# Patient Record
Sex: Male | Born: 1975 | Race: White | Hispanic: No | Marital: Single | Smoking: Never smoker
Health system: Southern US, Community
[De-identification: ages and names within clinical notes are randomized; demographics above are authoritative.]

## PROBLEM LIST (undated history)

## (undated) DIAGNOSIS — M109 Gout, unspecified: Secondary | ICD-10-CM

## (undated) DIAGNOSIS — R911 Solitary pulmonary nodule: Secondary | ICD-10-CM

## (undated) DIAGNOSIS — F329 Major depressive disorder, single episode, unspecified: Secondary | ICD-10-CM

## (undated) DIAGNOSIS — R42 Dizziness and giddiness: Secondary | ICD-10-CM

## (undated) DIAGNOSIS — F410 Panic disorder [episodic paroxysmal anxiety] without agoraphobia: Secondary | ICD-10-CM

## (undated) DIAGNOSIS — M419 Scoliosis, unspecified: Secondary | ICD-10-CM

## (undated) DIAGNOSIS — N5089 Other specified disorders of the male genital organs: Secondary | ICD-10-CM

## (undated) DIAGNOSIS — G5790 Unspecified mononeuropathy of unspecified lower limb: Secondary | ICD-10-CM

## (undated) DIAGNOSIS — K284 Chronic or unspecified gastrojejunal ulcer with hemorrhage: Secondary | ICD-10-CM

## (undated) DIAGNOSIS — F429 Obsessive-compulsive disorder, unspecified: Secondary | ICD-10-CM

## (undated) DIAGNOSIS — M4800 Spinal stenosis, site unspecified: Secondary | ICD-10-CM

## (undated) DIAGNOSIS — N200 Calculus of kidney: Secondary | ICD-10-CM

## (undated) DIAGNOSIS — K274 Chronic or unspecified peptic ulcer, site unspecified, with hemorrhage: Secondary | ICD-10-CM

## (undated) DIAGNOSIS — G629 Polyneuropathy, unspecified: Secondary | ICD-10-CM

## (undated) HISTORY — PX: KIDNEY STONE SURGERY: SHX686

## (undated) HISTORY — PX: OTHER SURGICAL HISTORY: SHX169

---

## 2002-02-04 ENCOUNTER — Emergency Department (HOSPITAL_COMMUNITY): Admission: EM | Admit: 2002-02-04 | Discharge: 2002-02-04 | Payer: Self-pay | Admitting: Emergency Medicine

## 2005-02-04 ENCOUNTER — Emergency Department (HOSPITAL_COMMUNITY): Admission: EM | Admit: 2005-02-04 | Discharge: 2005-02-04 | Payer: Self-pay | Admitting: Emergency Medicine

## 2005-12-24 ENCOUNTER — Emergency Department (HOSPITAL_COMMUNITY): Admission: EM | Admit: 2005-12-24 | Discharge: 2005-12-24 | Payer: Self-pay | Admitting: Emergency Medicine

## 2008-03-10 ENCOUNTER — Emergency Department (HOSPITAL_COMMUNITY): Admission: EM | Admit: 2008-03-10 | Discharge: 2008-03-10 | Payer: Self-pay | Admitting: Emergency Medicine

## 2008-06-02 ENCOUNTER — Inpatient Hospital Stay (HOSPITAL_COMMUNITY): Admission: AD | Admit: 2008-06-02 | Discharge: 2008-06-03 | Payer: Self-pay | Admitting: *Deleted

## 2008-06-02 ENCOUNTER — Ambulatory Visit: Payer: Self-pay | Admitting: *Deleted

## 2008-06-02 ENCOUNTER — Emergency Department (HOSPITAL_COMMUNITY): Admission: EM | Admit: 2008-06-02 | Discharge: 2008-06-02 | Payer: Self-pay | Admitting: Emergency Medicine

## 2009-06-13 ENCOUNTER — Emergency Department (HOSPITAL_COMMUNITY): Admission: EM | Admit: 2009-06-13 | Discharge: 2009-06-14 | Payer: Self-pay | Admitting: Emergency Medicine

## 2009-06-13 IMAGING — CR DG CHEST 2V
2 series · 2 of 2 positions shown · non-contrast
Comparison: [DATE]

CLINICAL DATA: Cough, tingling and pain and extremities

CHEST - 2 VIEW

[w chest lat]
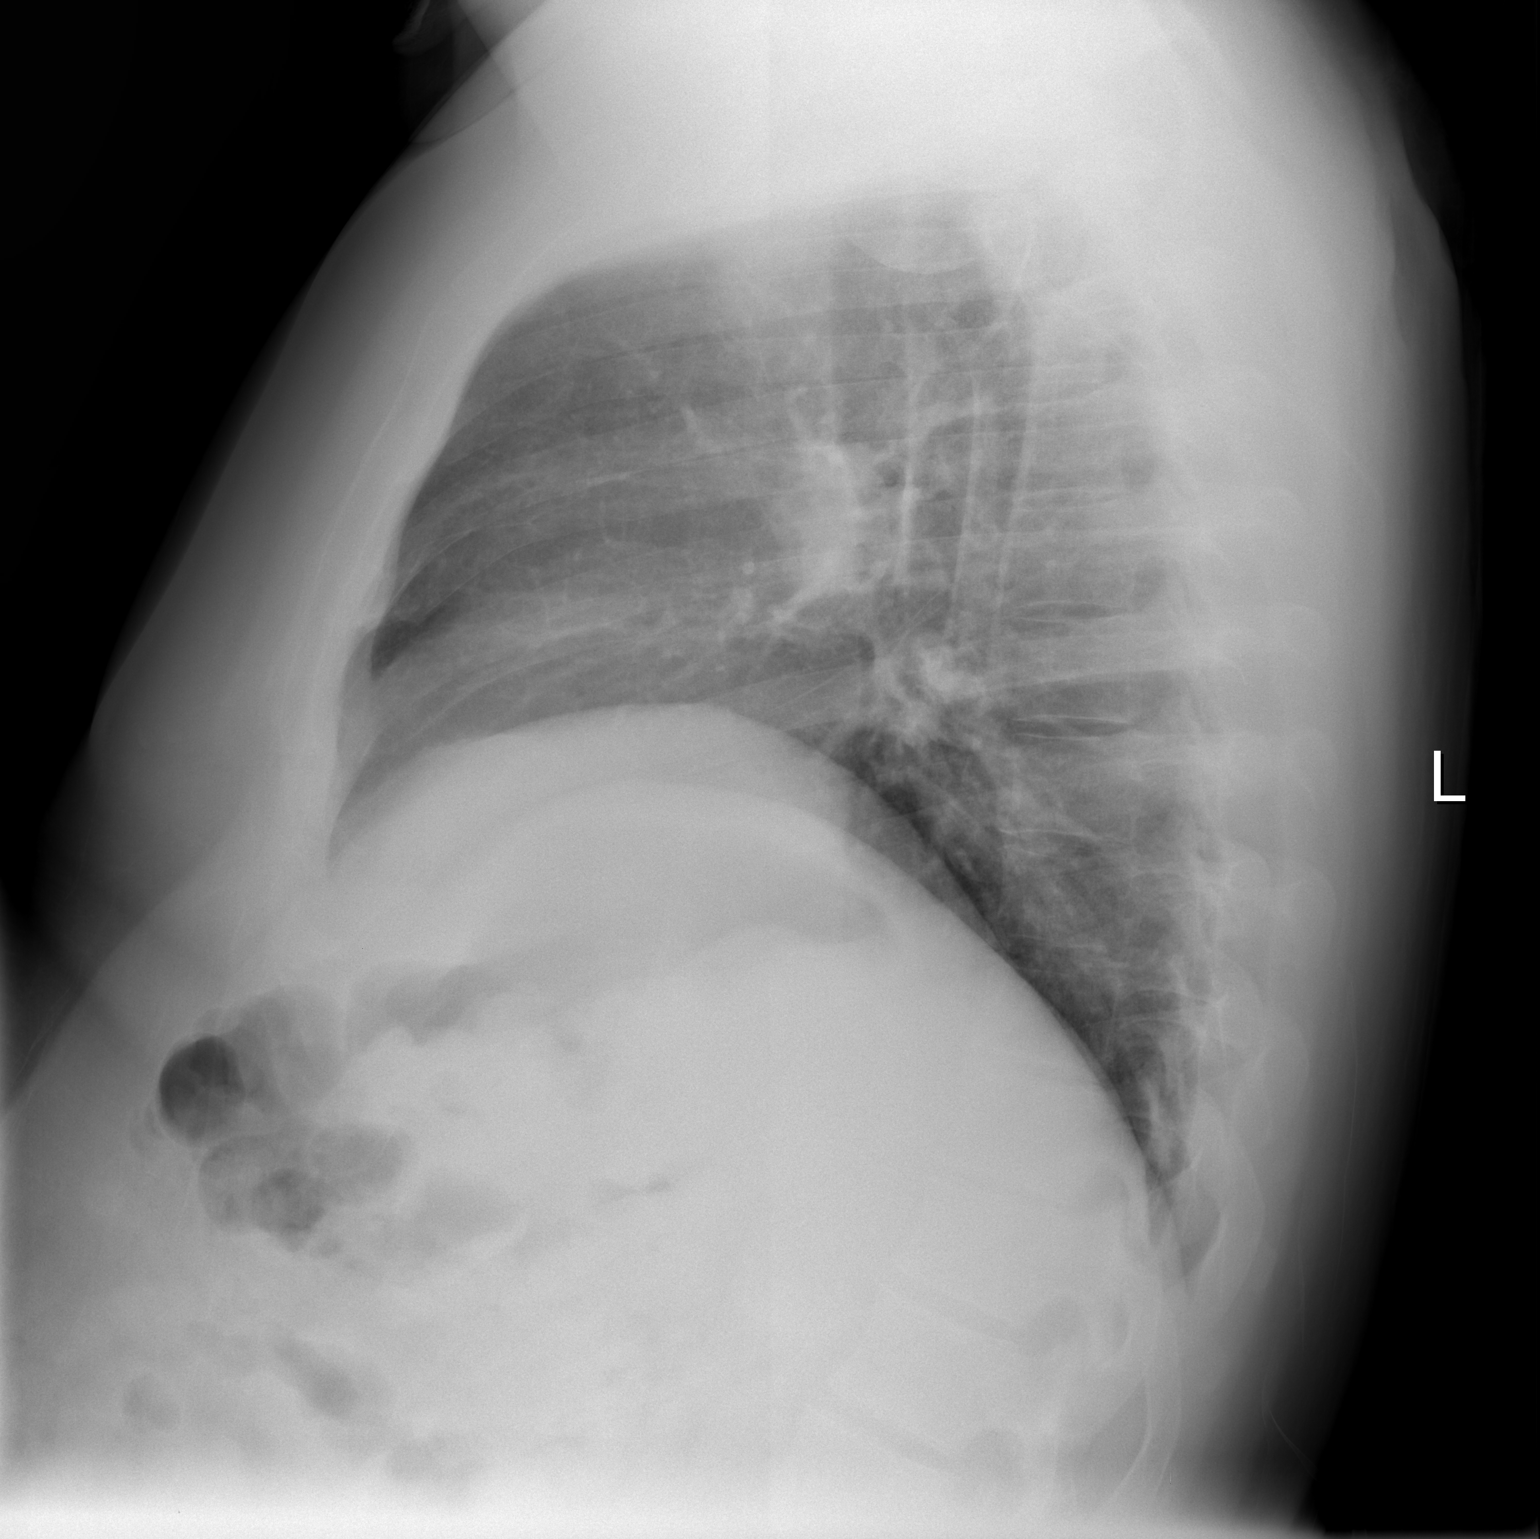

[view not recorded]
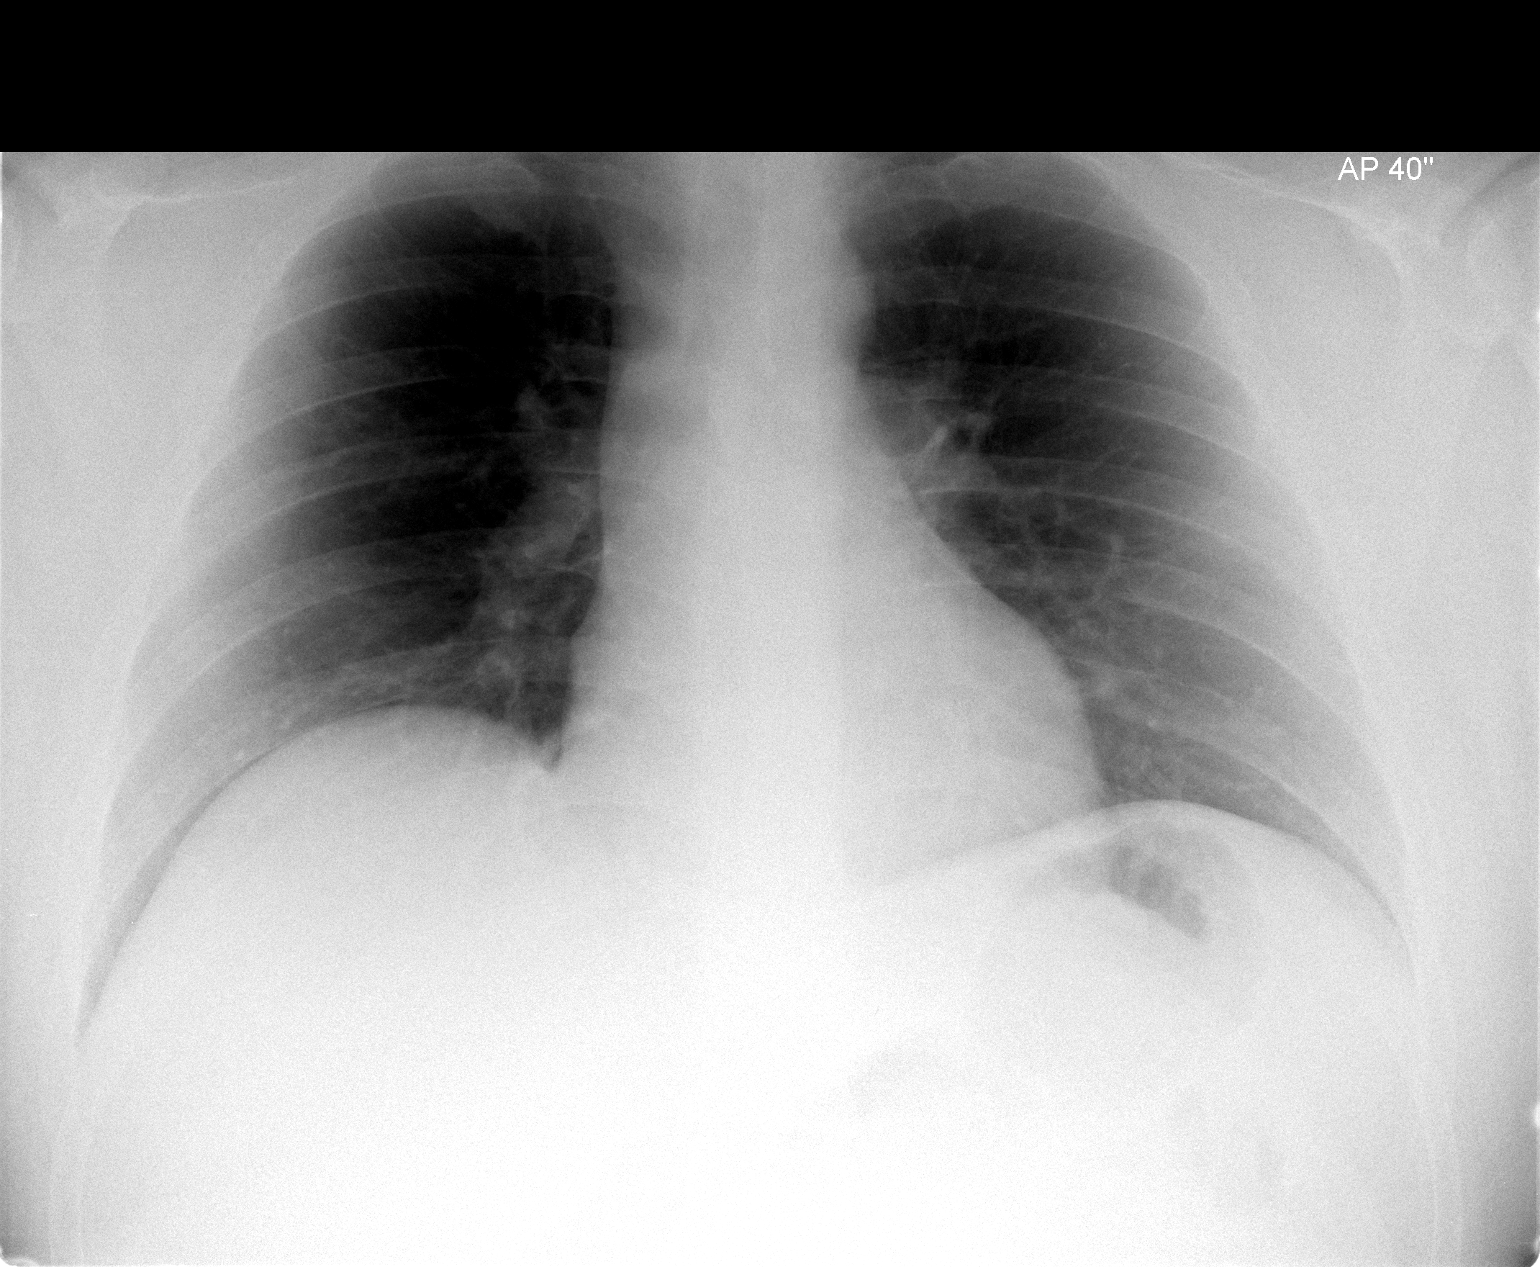

[2 of 2 positions shown; findings below may reference images not displayed]

FINDINGS: Normal heart size, mediastinal contours, and pulmonary vascularity.
Lungs clear.
Bones unremarkable.
No pneumothorax.
IMPRESSION: No acute abnormalities.

## 2011-02-24 LAB — CBC
Hemoglobin: 15.7 g/dL (ref 13.0–17.0)
MCHC: 34.3 g/dL (ref 30.0–36.0)
Platelets: 257 10*3/uL (ref 150–400)
RDW: 13.5 % (ref 11.5–15.5)

## 2011-02-24 LAB — DIFFERENTIAL
Basophils Relative: 0 % (ref 0–1)
Eosinophils Absolute: 0.4 10*3/uL (ref 0.0–0.7)
Eosinophils Relative: 3 % (ref 0–5)
Lymphs Abs: 3.2 10*3/uL (ref 0.7–4.0)
Monocytes Absolute: 1.1 10*3/uL — ABNORMAL HIGH (ref 0.1–1.0)
Monocytes Relative: 9 % (ref 3–12)

## 2011-02-24 LAB — URINALYSIS, ROUTINE W REFLEX MICROSCOPIC
Ketones, ur: NEGATIVE mg/dL
Nitrite: NEGATIVE
Specific Gravity, Urine: 1.027 (ref 1.005–1.030)
Urobilinogen, UA: 1 mg/dL (ref 0.0–1.0)
pH: 6 (ref 5.0–8.0)

## 2011-02-24 LAB — COMPREHENSIVE METABOLIC PANEL
ALT: 28 U/L (ref 0–53)
AST: 21 U/L (ref 0–37)
Albumin: 3.8 g/dL (ref 3.5–5.2)
Alkaline Phosphatase: 74 U/L (ref 39–117)
Calcium: 8.4 mg/dL (ref 8.4–10.5)
GFR calc Af Amer: >60 mL/min (ref >60)
Glucose, Bld: 102 mg/dL — ABNORMAL HIGH (ref 70–99)
Potassium: 4.1 mEq/L (ref 3.5–5.1)
Sodium: 140 mEq/L (ref 135–145)
Total Protein: 6.3 g/dL (ref 6.0–8.3)

## 2011-03-28 ENCOUNTER — Emergency Department (INDEPENDENT_AMBULATORY_CARE_PROVIDER_SITE_OTHER): Payer: Self-pay

## 2011-03-28 ENCOUNTER — Emergency Department (HOSPITAL_BASED_OUTPATIENT_CLINIC_OR_DEPARTMENT_OTHER)
Admission: EM | Admit: 2011-03-28 | Discharge: 2011-03-28 | Disposition: A | Discharge status: Home / Self Care | Payer: Self-pay | Attending: Emergency Medicine | Admitting: Emergency Medicine

## 2011-03-28 DIAGNOSIS — M79609 Pain in unspecified limb: Secondary | ICD-10-CM

## 2011-03-28 DIAGNOSIS — M899 Disorder of bone, unspecified: Secondary | ICD-10-CM

## 2011-03-28 IMAGING — CR DG TOE GREAT 2+V*R*
3 series · 3 of 3 positions shown · non-contrast
Comparison: None.

CLINICAL DATA: Pain in the right first toe for 3-year 4 days
without injury.

RIGHT TOE - 2+ VIEW

[t toes ap right]
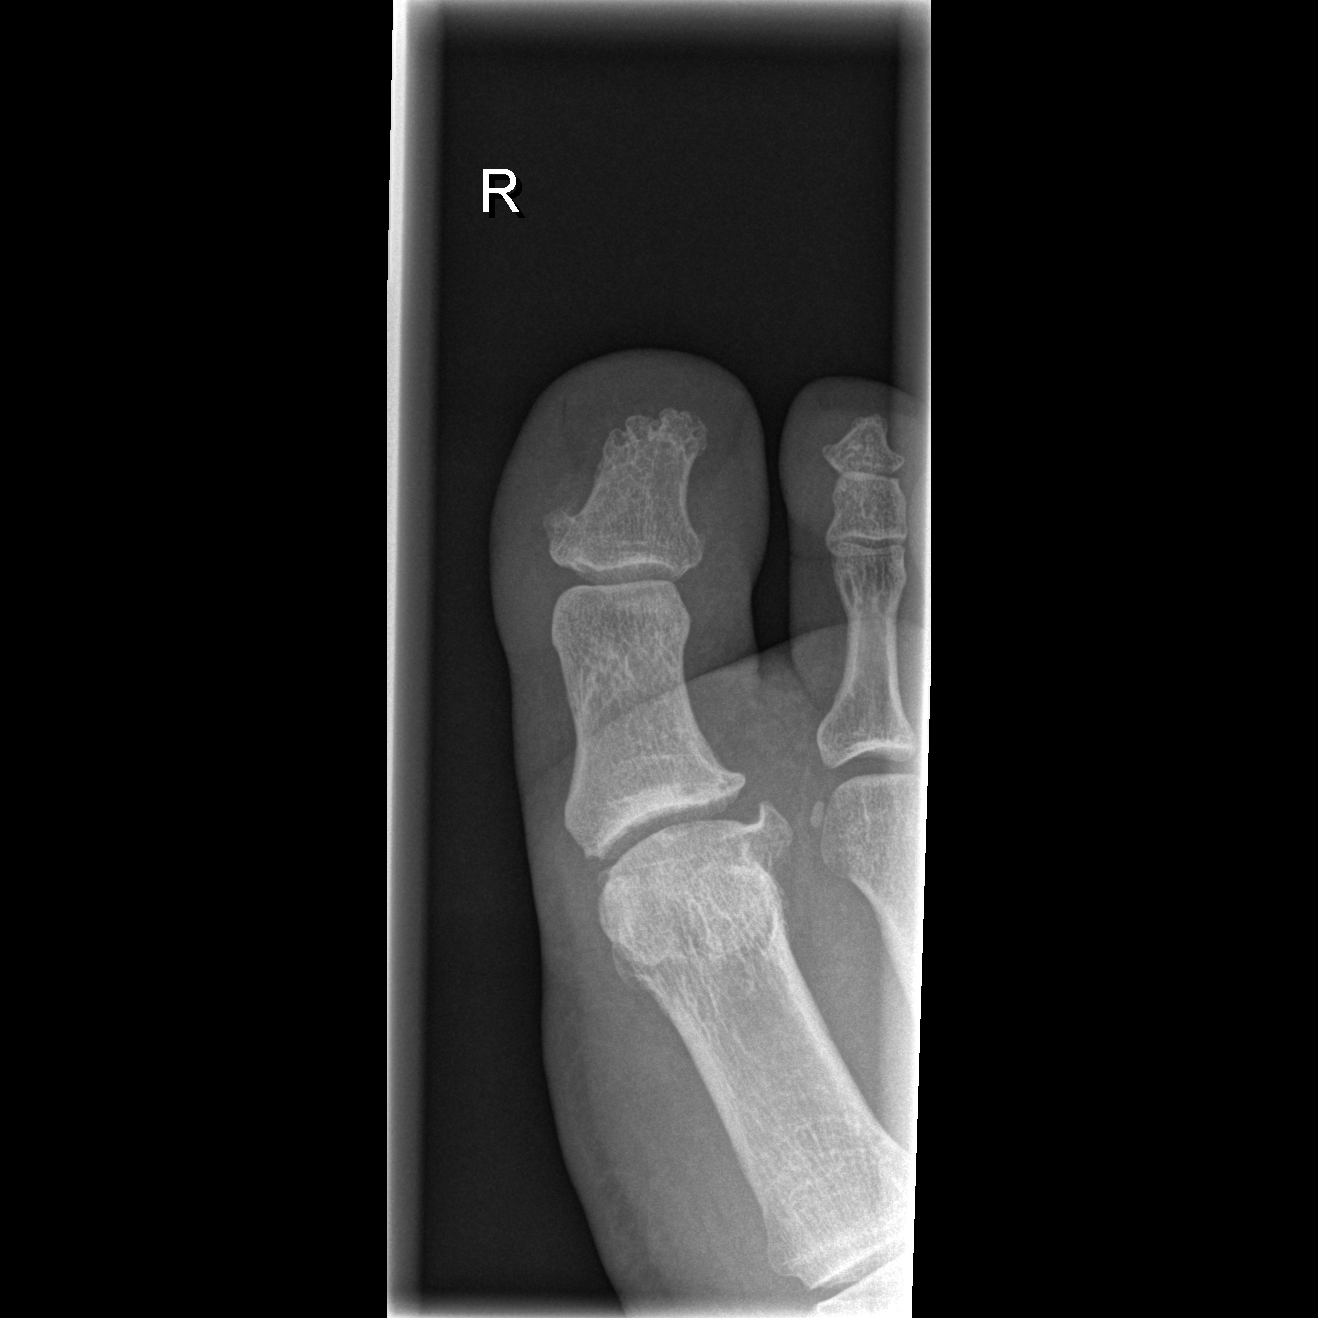

[t toes oblique right]
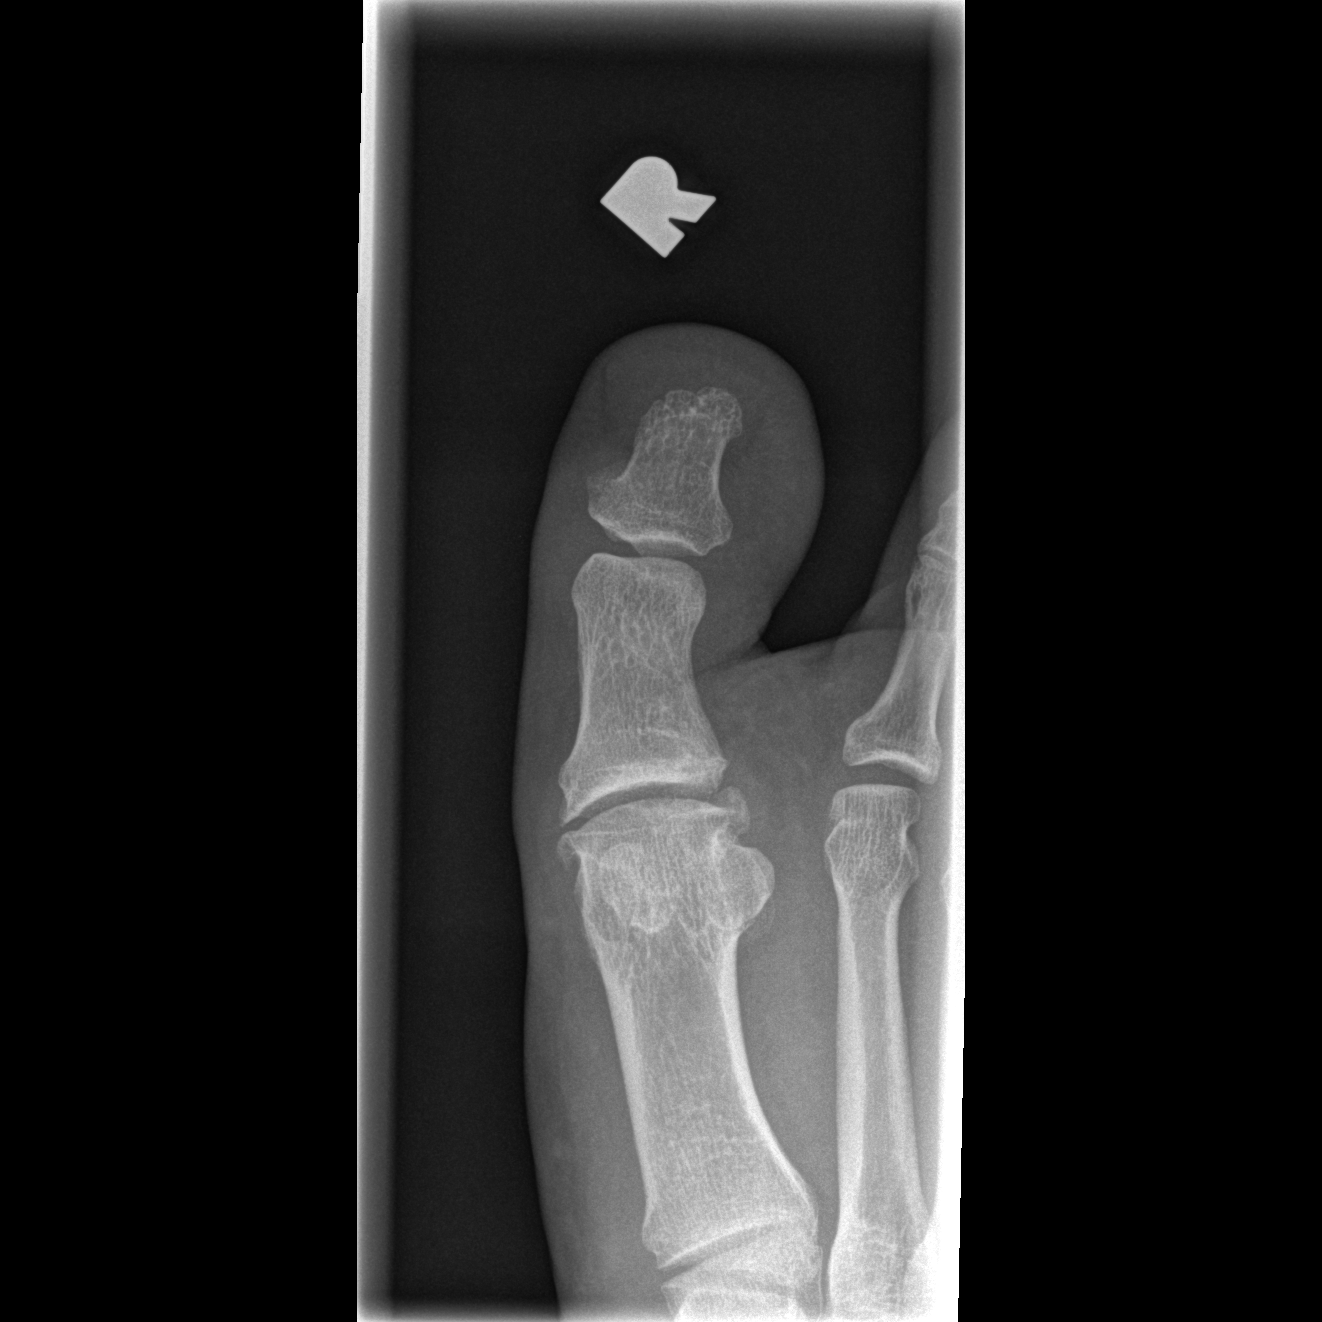

[t toes lateral right]
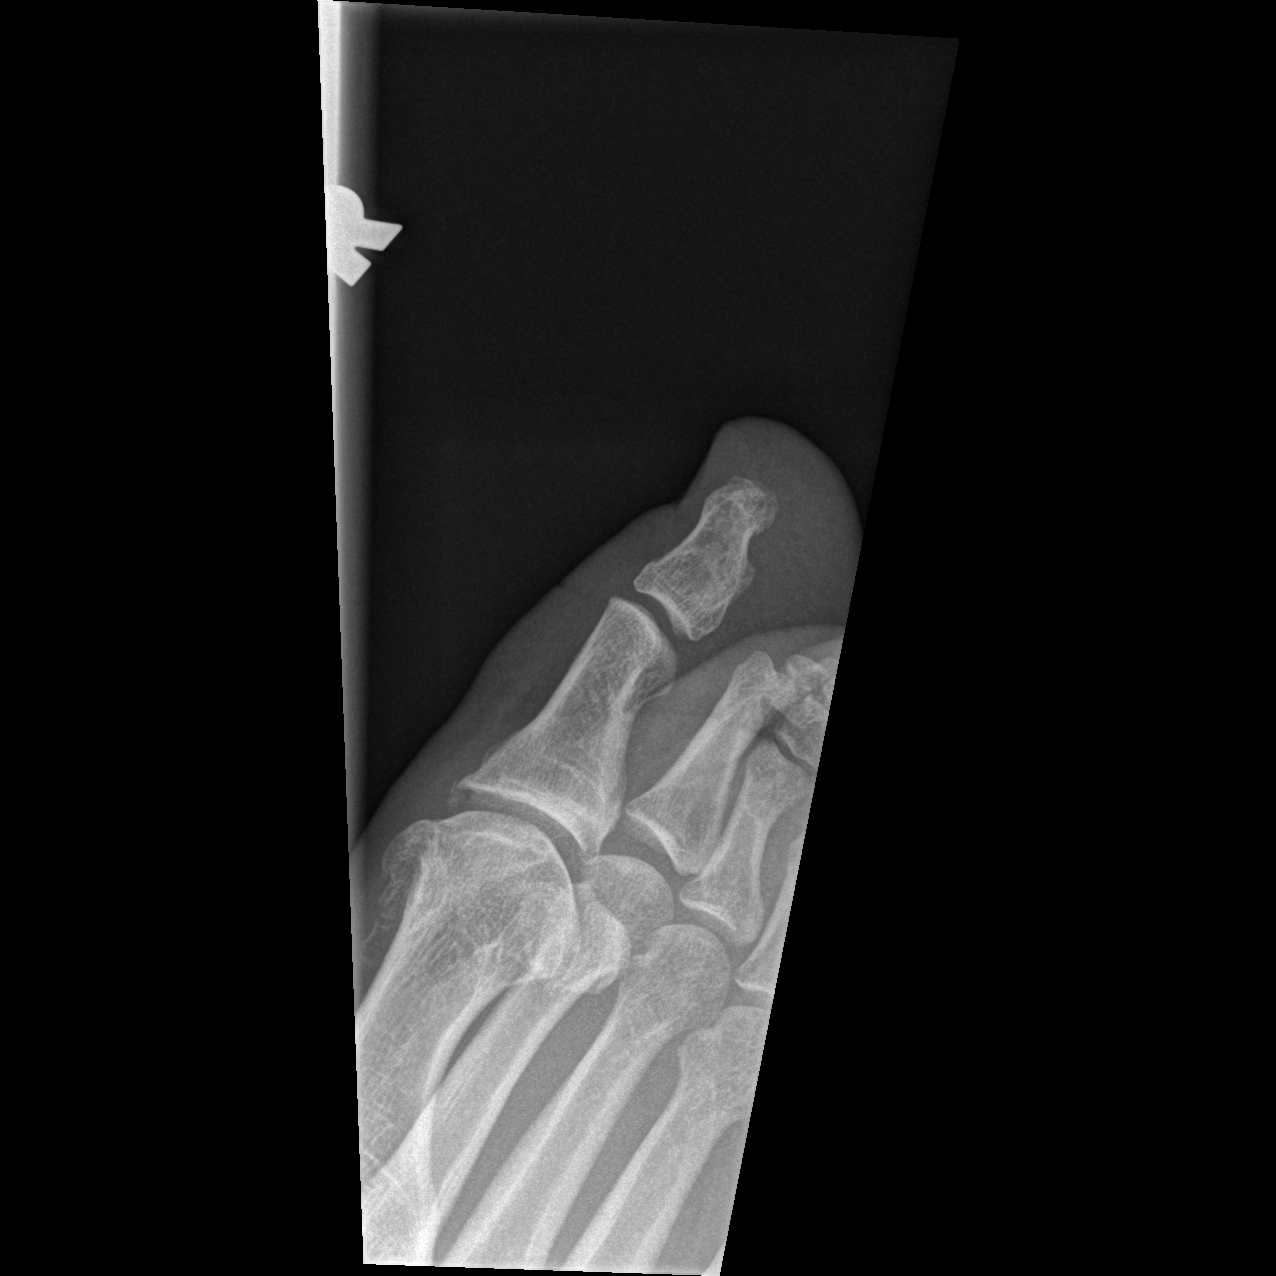

[3 of 3 positions shown; findings below may reference images not displayed]

FINDINGS: Degenerative changes in the first metatarsal phalangeal
joint.  There appears to be some tuft erosion in the right first
toe distal phalanx.  Mild tuft erosion is also suggested in the
second toe.  Changes could be related to scleroderma or other
erosive process.  No evidence of acute fracture or subluxation.
IMPRESSION: Degenerative changes in the first metatarsal phalangeal joint.
Changes of mild tuft erosion in the first and second toes which
might be related to scleroderma or other erosive process.  No acute
fracture.

## 2011-03-30 ENCOUNTER — Emergency Department (HOSPITAL_BASED_OUTPATIENT_CLINIC_OR_DEPARTMENT_OTHER)
Admission: EM | Admit: 2011-03-30 | Discharge: 2011-03-30 | Disposition: A | Discharge status: Home / Self Care | Payer: Self-pay | Attending: Emergency Medicine | Admitting: Emergency Medicine

## 2011-03-30 DIAGNOSIS — M109 Gout, unspecified: Secondary | ICD-10-CM | POA: Insufficient documentation

## 2011-03-30 DIAGNOSIS — M25579 Pain in unspecified ankle and joints of unspecified foot: Secondary | ICD-10-CM | POA: Insufficient documentation

## 2011-04-02 NOTE — Discharge Summary (Signed)
Gene Hansen, BRACH NO.:  [id number]   MEDICAL RECORD NO.:  [id number]          PATIENT TYPE:  IPS   LOCATION:  0302                          FACILITY:  [facility]   PHYSICIAN:  Gene Hansen, M.D. DATE OF BIRTH:  January 18, 1976   DATE OF ADMISSION:  06/02/2008  DATE OF DISCHARGE:  06/03/2008                               DISCHARGE SUMMARY   IDENTIFYING INFORMATION:  A 35 year old Caucasian male, single.  This is  a voluntary admission.   HISTORY OF PRESENT ILLNESS:  First inpatient psychiatric admission for  this pleasant 35 year old who presented in the emergency room yesterday  after he was found by a neighbor.  He had taken about 15 tablets of his  mother's alprazolam 0.5 mg.  He was rather drowsy and staggering around  the house.  The neighbor called EMS.  Today he says that he does not  know what possessed him to take the medications.  He admits that he has  been depressed lately, stressed because of his inability to get a job.  Every morning he goes out looking for work and fills out applications  but has not worked now since the end of 05-Apr-2007.  Getting very frustrated.  He is currently living at home with his mother and that relationship is  good.  He also has friends at church who are his primary support  community.  He denies substance abuse or prior suicide attempts and  denies that he had been contemplating suicide.  He has admitted some  depressed mood since his father passed away in 04/05/03.  He is denying  active suicidal thoughts today.   PAST PSYCHIATRIC HISTORY:  No prior psychiatric admissions.  No prior  trials of psychotropic medications.  No history of learning disability.  He denies any history of abuse in childhood.  No current or past history  of substance abuse   SOCIAL HISTORY:  Single Caucasian male, not currently in any close  relationship; does have some close friends that are his primary support  group through church.  Attends church  regularly.  Living at home with  his mother here in [facility] and that is a supportive  relationship.  They are financially stable.  He is unemployed since  04/05/2007.  No history of legal problems.  He is an only child.  He did well  in school, made good grades and also played sports.   MEDICAL HISTORY:  No regular primary care [facility].   MEDICAL PROBLEMS:  Are GERD.   MEDICATIONS:  Are Prilosec 20 mg daily which he takes over-the-counter  for stomach acid.   DRUG ALLERGIES:  Are none.   PHYSICAL EXAM:  GENERAL:  Was done in the emergency room and he was  noted to be rather drowsy and lethargic but oriented x3 at all times.  VITAL SIGNS:  Temperature 98.8, pulse 119, respirations 17, blood  pressure 131/93.  He is 5 feet 11 inches tall, 268 pounds.   DIAGNOSTIC STUDIES:  Revealed a normal urinalysis.  Alcohol level less  than 5.  Salicylate and acetaminophen levels were  negative.  CBC - WBC  11.7, hemoglobin 15.8, hematocrit 46.3 and platelets 262,000.  Chemistry  - sodium 145, potassium 3.9, chloride 111, carbon dioxide 25, BUN 9,  creatinine 1.16.  Liver enzymes normal.  Urine drug screen was positive  for benzodiazepines and negative for all other substances.   MENTAL STATUS EXAM:  He is a fully alert male.  His speech is just very  slightly slurred.  He does look just a little bit sedated but he is  oriented x4, in full contact with reality.  Gives a rather detailed  history.  Has a sense of humor.  Regrets his actions yesterday and says  he really does not understand what made him overdose.  He feels that he  does have a lot of positive supports at home and would like to return  home to his mother.  He is interested in following up with counseling  and agrees to follow up at the [facility] psychology clinic.  Thought process  is logical, coherent.  Insight is adequate.  Mood is depressed.  He  acknowledges that he relies heavily on prayer and his faith as a  supportive factor.   Denying any suicidal or homicidal thought.   AXIS I:  Depressive disorder, NOS.  AXIS II:  No diagnosis.  AXIS III:  Post benzodiazepine overdose.  AXIS IV:  Severe issues with unemployment.  AXIS V:  Current 59, past year 66 estimated.   PLAN:  Is to discharge the patient today.  We made him an appointment at  the [facility] psychology clinic on July 23 at 3 p.m. for an evaluation and our  case worker has spoken with his mother today who does feel that he can  be safe at home and has agreed to have him come home with her today.      Margaret A. Lorin Picket, N.P.      Gene Hansen, M.D.  Electronically Signed    MAS/MEDQ  D:  06/03/2008  T:  06/03/2008  Job:  [id number]

## 2011-04-15 ENCOUNTER — Emergency Department (HOSPITAL_BASED_OUTPATIENT_CLINIC_OR_DEPARTMENT_OTHER)
Admission: EM | Admit: 2011-04-15 | Discharge: 2011-04-15 | Disposition: A | Discharge status: Home / Self Care | Payer: Self-pay | Attending: Emergency Medicine | Admitting: Emergency Medicine

## 2011-04-15 DIAGNOSIS — M109 Gout, unspecified: Secondary | ICD-10-CM | POA: Insufficient documentation

## 2011-04-15 DIAGNOSIS — M25579 Pain in unspecified ankle and joints of unspecified foot: Secondary | ICD-10-CM | POA: Insufficient documentation

## 2011-04-16 ENCOUNTER — Emergency Department (HOSPITAL_BASED_OUTPATIENT_CLINIC_OR_DEPARTMENT_OTHER)
Admission: EM | Admit: 2011-04-16 | Discharge: 2011-04-16 | Disposition: A | Discharge status: Home / Self Care | Payer: Self-pay | Attending: Emergency Medicine | Admitting: Emergency Medicine

## 2011-04-16 ENCOUNTER — Emergency Department (INDEPENDENT_AMBULATORY_CARE_PROVIDER_SITE_OTHER): Payer: Self-pay

## 2011-04-16 DIAGNOSIS — R109 Unspecified abdominal pain: Secondary | ICD-10-CM

## 2011-04-16 DIAGNOSIS — R110 Nausea: Secondary | ICD-10-CM

## 2011-04-16 DIAGNOSIS — N201 Calculus of ureter: Secondary | ICD-10-CM

## 2011-04-16 DIAGNOSIS — K7689 Other specified diseases of liver: Secondary | ICD-10-CM | POA: Insufficient documentation

## 2011-04-16 DIAGNOSIS — N200 Calculus of kidney: Secondary | ICD-10-CM | POA: Insufficient documentation

## 2011-04-16 LAB — COMPREHENSIVE METABOLIC PANEL
ALT: 39 U/L (ref 0–53)
Alkaline Phosphatase: 76 U/L (ref 39–117)
BUN: 19 mg/dL (ref 6–23)
CO2: 25 mEq/L (ref 19–32)
GFR calc non Af Amer: >60 mL/min (ref >60)
Glucose, Bld: 148 mg/dL — ABNORMAL HIGH (ref 70–99)
Potassium: 3.7 mEq/L (ref 3.5–5.1)
Sodium: 142 mEq/L (ref 135–145)

## 2011-04-16 LAB — DIFFERENTIAL
Basophils Absolute: 0 10*3/uL (ref 0.0–0.1)
Eosinophils Relative: 1 % (ref 0–5)
Lymphocytes Relative: 23 % (ref 12–46)
Lymphs Abs: 3.8 10*3/uL (ref 0.7–4.0)
Monocytes Absolute: 1.2 10*3/uL — ABNORMAL HIGH (ref 0.1–1.0)
Neutro Abs: 11.4 10*3/uL — ABNORMAL HIGH (ref 1.7–7.7)

## 2011-04-16 LAB — URINALYSIS, ROUTINE W REFLEX MICROSCOPIC
Bilirubin Urine: NEGATIVE
Glucose, UA: 500 mg/dL — AB
Protein, ur: NEGATIVE mg/dL
Urobilinogen, UA: 0.2 mg/dL (ref 0.0–1.0)

## 2011-04-16 LAB — CBC
HCT: 43.9 % (ref 39.0–52.0)
Hemoglobin: 15.4 g/dL (ref 13.0–17.0)
MCHC: 35.1 g/dL (ref 30.0–36.0)
MCV: 83.3 fL (ref 78.0–100.0)
WBC: 16.5 10*3/uL — ABNORMAL HIGH (ref 4.0–10.5)

## 2011-04-16 LAB — URINE MICROSCOPIC-ADD ON

## 2011-04-19 ENCOUNTER — Emergency Department (HOSPITAL_BASED_OUTPATIENT_CLINIC_OR_DEPARTMENT_OTHER)
Admission: EM | Admit: 2011-04-19 | Discharge: 2011-04-19 | Disposition: A | Discharge status: Home / Self Care | Payer: Self-pay | Attending: Emergency Medicine | Admitting: Emergency Medicine

## 2011-04-19 DIAGNOSIS — F3289 Other specified depressive episodes: Secondary | ICD-10-CM | POA: Insufficient documentation

## 2011-04-19 DIAGNOSIS — M109 Gout, unspecified: Secondary | ICD-10-CM | POA: Insufficient documentation

## 2011-04-19 DIAGNOSIS — N201 Calculus of ureter: Secondary | ICD-10-CM | POA: Insufficient documentation

## 2011-04-19 DIAGNOSIS — F329 Major depressive disorder, single episode, unspecified: Secondary | ICD-10-CM | POA: Insufficient documentation

## 2011-04-19 DIAGNOSIS — M79609 Pain in unspecified limb: Secondary | ICD-10-CM | POA: Insufficient documentation

## 2011-04-19 DIAGNOSIS — Z79899 Other long term (current) drug therapy: Secondary | ICD-10-CM | POA: Insufficient documentation

## 2011-04-19 DIAGNOSIS — Z8589 Personal history of malignant neoplasm of other organs and systems: Secondary | ICD-10-CM | POA: Insufficient documentation

## 2011-04-19 LAB — CBC
Platelets: 307 10*3/uL (ref 150–400)
RBC: 5.44 MIL/uL (ref 4.22–5.81)
RDW: 13.3 % (ref 11.5–15.5)
WBC: 12.6 10*3/uL — ABNORMAL HIGH (ref 4.0–10.5)

## 2011-04-19 LAB — URINALYSIS, ROUTINE W REFLEX MICROSCOPIC
Bilirubin Urine: NEGATIVE
Leukocytes, UA: NEGATIVE
Nitrite: NEGATIVE
Specific Gravity, Urine: 1.014 (ref 1.005–1.030)
Urobilinogen, UA: 0.2 mg/dL (ref 0.0–1.0)

## 2011-04-19 LAB — URINE MICROSCOPIC-ADD ON

## 2011-04-19 LAB — BASIC METABOLIC PANEL
BUN: 19 mg/dL (ref 6–23)
Calcium: 9.1 mg/dL (ref 8.4–10.5)
Chloride: 105 mEq/L (ref 96–112)
Creatinine, Ser: 1.2 mg/dL (ref 0.4–1.5)

## 2011-04-20 ENCOUNTER — Emergency Department (HOSPITAL_COMMUNITY)
Admission: EM | Admit: 2011-04-20 | Discharge: 2011-04-20 | Disposition: A | Discharge status: Home / Self Care | Payer: Self-pay | Attending: Emergency Medicine | Admitting: Emergency Medicine

## 2011-04-20 DIAGNOSIS — N201 Calculus of ureter: Secondary | ICD-10-CM | POA: Insufficient documentation

## 2011-04-20 DIAGNOSIS — R109 Unspecified abdominal pain: Secondary | ICD-10-CM | POA: Insufficient documentation

## 2011-04-20 DIAGNOSIS — M109 Gout, unspecified: Secondary | ICD-10-CM | POA: Insufficient documentation

## 2011-04-20 LAB — POCT I-STAT, CHEM 8
BUN: 22 mg/dL (ref 6–23)
Hemoglobin: 15.3 g/dL (ref 13.0–17.0)
Sodium: 143 mEq/L (ref 135–145)
TCO2: 21 mmol/L (ref 0–100)

## 2011-04-20 LAB — DIFFERENTIAL
Basophils Absolute: 0 10*3/uL (ref 0.0–0.1)
Basophils Relative: 0 % (ref 0–1)
Eosinophils Absolute: 0.2 10*3/uL (ref 0.0–0.7)
Monocytes Relative: 7 % (ref 3–12)
Neutrophils Relative %: 57 % (ref 43–77)

## 2011-04-20 LAB — URINALYSIS, ROUTINE W REFLEX MICROSCOPIC
Leukocytes, UA: NEGATIVE
Protein, ur: NEGATIVE mg/dL
Urobilinogen, UA: 0.2 mg/dL (ref 0.0–1.0)

## 2011-04-20 LAB — CBC
MCH: 29.2 pg (ref 26.0–34.0)
MCHC: 34.1 g/dL (ref 30.0–36.0)
Platelets: 262 10*3/uL (ref 150–400)
RBC: 5.17 MIL/uL (ref 4.22–5.81)

## 2011-04-20 LAB — URINE MICROSCOPIC-ADD ON

## 2011-04-24 ENCOUNTER — Emergency Department (HOSPITAL_BASED_OUTPATIENT_CLINIC_OR_DEPARTMENT_OTHER)
Admission: EM | Admit: 2011-04-24 | Discharge: 2011-04-24 | Disposition: A | Discharge status: Home / Self Care | Payer: Self-pay | Attending: Emergency Medicine | Admitting: Emergency Medicine

## 2011-04-24 DIAGNOSIS — N201 Calculus of ureter: Secondary | ICD-10-CM | POA: Insufficient documentation

## 2011-04-24 DIAGNOSIS — F329 Major depressive disorder, single episode, unspecified: Secondary | ICD-10-CM | POA: Insufficient documentation

## 2011-04-24 DIAGNOSIS — R1031 Right lower quadrant pain: Secondary | ICD-10-CM | POA: Insufficient documentation

## 2011-04-24 DIAGNOSIS — F3289 Other specified depressive episodes: Secondary | ICD-10-CM | POA: Insufficient documentation

## 2011-04-24 LAB — URIC ACID: Uric Acid, Serum: 6.3 mg/dL (ref 4.0–7.8)

## 2011-04-24 LAB — URINALYSIS, ROUTINE W REFLEX MICROSCOPIC
Glucose, UA: NEGATIVE mg/dL
Ketones, ur: NEGATIVE mg/dL
Protein, ur: NEGATIVE mg/dL

## 2011-04-24 LAB — URINE MICROSCOPIC-ADD ON

## 2011-04-25 NOTE — Consult Note (Signed)
NAMEDAITON, COWLES NO.:  [id number]  MEDICAL RECORD NO.:  [id number]           PATIENT TYPE:  E  LOCATION:  WLED                         FACILITY:  [facility]  PHYSICIAN:  Caressa Scearce I. Patsi Sears, M.D.DATE OF BIRTH:  October 02, 1976  DATE OF CONSULTATION:  04/20/2011 DATE OF DISCHARGE:                                CONSULTATION   TIME:  08:30  SUBJECTIVE:  This 35 year old single male seen for the fourth emergency room visit in 4 days for treatment evaluation for right upper ureteral calculus.  The patient has a history of gout and this is his first kidney stone.  The patient was in the emergency room 2 weeks ago, seen with right flank pain.  The CT shows that the patient has a 3.7-mm right upper ureteral calculus.  No hydronephrosis.  There is no gross hematuria.  Note the patient has a past history of gout, treated with allopurinol, prednisone, oxycodone for pain.  PAST MEDICAL HISTORY:  His past medical history is significant for: 1. Testicle cancer, lung cancer (undocumented).  The patient also has     depression treated by Dr. 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 2. History of gastric ulcer. 3. History of gout. 4. History pneumonia.  FAMILY HISTORY:  Diabetes and hypertension.  SURGICAL HISTORY:  Significant for "tumor" removal (unknown pathology, unknown surgery).  SOCIAL HISTORY:  The patient is a nonsmoker, nondrinker.  No drug abuse. He lives at home with his mother.  ALLERGIES:  None known.  HOME MEDICATIONS.: 1. Prednisone. 2. Allopurinol. 3. Paxil. 4. Oxycodone. 5. Ibuprofen.  PHYSICAL EXAMINATION:  GENERAL:  Physical exam shows a well-developed, well-nourished white male, in no acute distress.  The patient is exceedingly poor historian.  The patient's mother is also very poor historian.  Together, they believe that the patient's "testicle cancer was removed by a [place] woman at [facility]" by removing "2 tumors from his testicles through his rectum." ABDOMEN:  Soft, decreased bowel sounds without organomegaly or masses. There is no CVA pain. GENITOURINARY:  Normal scrotum, normal testicles. EXTREMITIES:  No cyanosis, no edema.  LABORATORY DATA:  Labs show a urine pH of 5.0 with no red cells.  BMET is normal.  White blood cell count 13,400.  CT scan reviewed and shows a 3.7 mm right upper ureteral stone with no hydro.  ASSESSMENT:  A 3.7-mm right upper ureteral stone, probably uric acid core.  This can be possibly dissolved with potassium citrate and Flomax. I have explained to the patient that because he does not have a solitary kidney, infection, gross hematuria, and his pain has been well controlled with one dose of Toradol that he should try to pass the stone, as emergency surgery would be risky, and possibly damaging to his kidney or his ureter.  If he can pass the stone on his own, this would be much better for him.  The patient is given strainer, prescription for potassium citrate, and Flomax.  He is given a card to call the office for followup appointment, as has been done in the  past by the emergency room physicians.     Shana Zavaleta I. Patsi Sears, M.D.     SIT/MEDQ  D:  04/20/2011  T:  04/20/2011  Job:  [id number]  cc:   Dr. Rhea Bleacher  Electronically Signed by Jethro Bolus M.D. on 04/25/2011 11:21:51 AM

## 2011-08-13 LAB — SAMPLE TO BLOOD BANK

## 2011-08-13 LAB — CBC
HCT: 43.3
MCV: 86.4
Platelets: 198
RBC: 5.01
WBC: 9.8

## 2011-08-13 LAB — DIFFERENTIAL
Eosinophils Absolute: 0
Eosinophils Relative: 0
Lymphocytes Relative: 18
Lymphs Abs: 1.7
Monocytes Relative: 9

## 2011-08-13 LAB — BASIC METABOLIC PANEL
Chloride: 105
GFR calc Af Amer: >60
GFR calc non Af Amer: >60
Potassium: 3.9
Sodium: 137

## 2011-08-16 LAB — URINALYSIS, ROUTINE W REFLEX MICROSCOPIC
Bilirubin Urine: NEGATIVE
Hgb urine dipstick: NEGATIVE
Ketones, ur: NEGATIVE
Protein, ur: NEGATIVE
Urobilinogen, UA: 0.2

## 2011-08-16 LAB — COMPREHENSIVE METABOLIC PANEL
AST: 30
Albumin: 4
Alkaline Phosphatase: 59
CO2: 25
Chloride: 111
GFR calc Af Amer: >60
GFR calc non Af Amer: >60
Potassium: 3.9
Total Bilirubin: 0.7

## 2011-08-16 LAB — SALICYLATE LEVEL: Salicylate Lvl: <4

## 2011-08-16 LAB — DIFFERENTIAL
Basophils Absolute: 0
Basophils Relative: 0
Eosinophils Absolute: 0.1
Eosinophils Relative: 1
Monocytes Absolute: 0.7

## 2011-08-16 LAB — CBC
HCT: 46.3
MCV: 87.2
Platelets: 262
RBC: 5.3
WBC: 11.7 — ABNORMAL HIGH

## 2011-08-16 LAB — ETHANOL: Alcohol, Ethyl (B): <5

## 2011-08-16 LAB — RAPID URINE DRUG SCREEN, HOSP PERFORMED
Amphetamines: NOT DETECTED
Opiates: NOT DETECTED
Tetrahydrocannabinol: NOT DETECTED

## 2013-11-24 ENCOUNTER — Emergency Department (HOSPITAL_BASED_OUTPATIENT_CLINIC_OR_DEPARTMENT_OTHER)
Admission: EM | Admit: 2013-11-24 | Discharge: 2013-11-24 | Disposition: A | Discharge status: Home / Self Care | Payer: Self-pay | Attending: Emergency Medicine | Admitting: Emergency Medicine

## 2013-11-24 ENCOUNTER — Encounter (HOSPITAL_BASED_OUTPATIENT_CLINIC_OR_DEPARTMENT_OTHER): Payer: Self-pay | Admitting: Emergency Medicine

## 2013-11-24 ENCOUNTER — Emergency Department (HOSPITAL_BASED_OUTPATIENT_CLINIC_OR_DEPARTMENT_OTHER): Payer: Self-pay

## 2013-11-24 DIAGNOSIS — Z8659 Personal history of other mental and behavioral disorders: Secondary | ICD-10-CM | POA: Insufficient documentation

## 2013-11-24 DIAGNOSIS — R69 Illness, unspecified: Secondary | ICD-10-CM

## 2013-11-24 DIAGNOSIS — Z8719 Personal history of other diseases of the digestive system: Secondary | ICD-10-CM | POA: Insufficient documentation

## 2013-11-24 DIAGNOSIS — Z79899 Other long term (current) drug therapy: Secondary | ICD-10-CM | POA: Insufficient documentation

## 2013-11-24 DIAGNOSIS — J111 Influenza due to unidentified influenza virus with other respiratory manifestations: Secondary | ICD-10-CM

## 2013-11-24 DIAGNOSIS — Z87442 Personal history of urinary calculi: Secondary | ICD-10-CM | POA: Insufficient documentation

## 2013-11-24 DIAGNOSIS — Z8669 Personal history of other diseases of the nervous system and sense organs: Secondary | ICD-10-CM | POA: Insufficient documentation

## 2013-11-24 DIAGNOSIS — Z8547 Personal history of malignant neoplasm of testis: Secondary | ICD-10-CM | POA: Insufficient documentation

## 2013-11-24 DIAGNOSIS — Z85118 Personal history of other malignant neoplasm of bronchus and lung: Secondary | ICD-10-CM | POA: Insufficient documentation

## 2013-11-24 DIAGNOSIS — R000 Tachycardia, unspecified: Secondary | ICD-10-CM

## 2013-11-24 DIAGNOSIS — M109 Gout, unspecified: Secondary | ICD-10-CM | POA: Insufficient documentation

## 2013-11-24 HISTORY — DX: Dizziness and giddiness: R42

## 2013-11-24 HISTORY — DX: Chronic or unspecified peptic ulcer, site unspecified, with hemorrhage: K27.4

## 2013-11-24 HISTORY — DX: Obsessive-compulsive disorder, unspecified: F42.9

## 2013-11-24 HISTORY — DX: Major depressive disorder, single episode, unspecified: F32.9

## 2013-11-24 HISTORY — DX: Unspecified mononeuropathy of unspecified lower limb: G57.90

## 2013-11-24 HISTORY — DX: Chronic or unspecified gastrojejunal ulcer with hemorrhage: K28.4

## 2013-11-24 HISTORY — DX: Calculus of kidney: N20.0

## 2013-11-24 HISTORY — DX: Gout, unspecified: M10.9

## 2013-11-24 HISTORY — DX: Panic disorder (episodic paroxysmal anxiety): F41.0

## 2013-11-24 IMAGING — CR DG CHEST 2V
2 series · 2 of 2 positions shown · non-contrast
Comparison: [DATE]

CLINICAL DATA: Testicular and lung carcinoma.

EXAM:
CHEST  2 VIEW

[w chest pa]
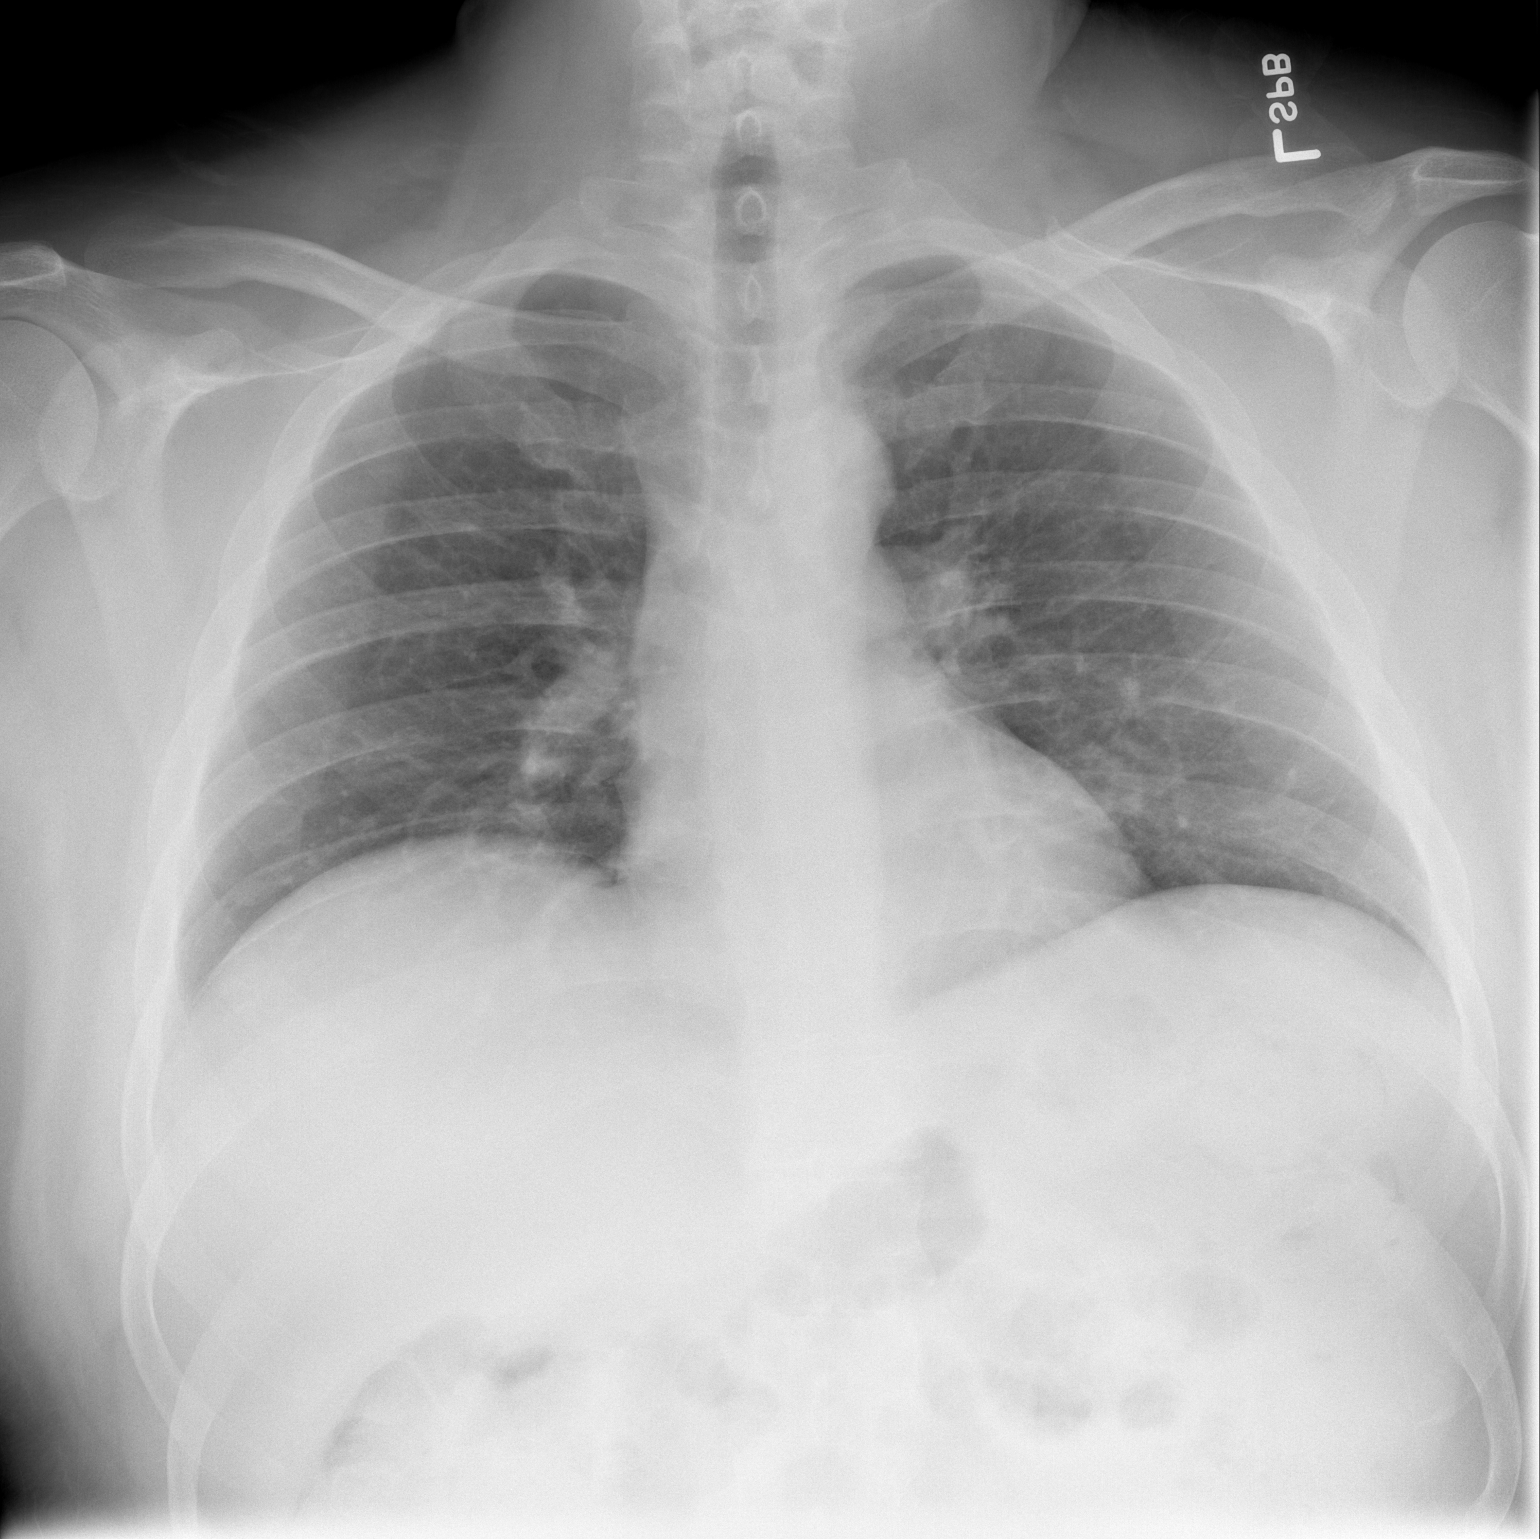

[w chest lat]
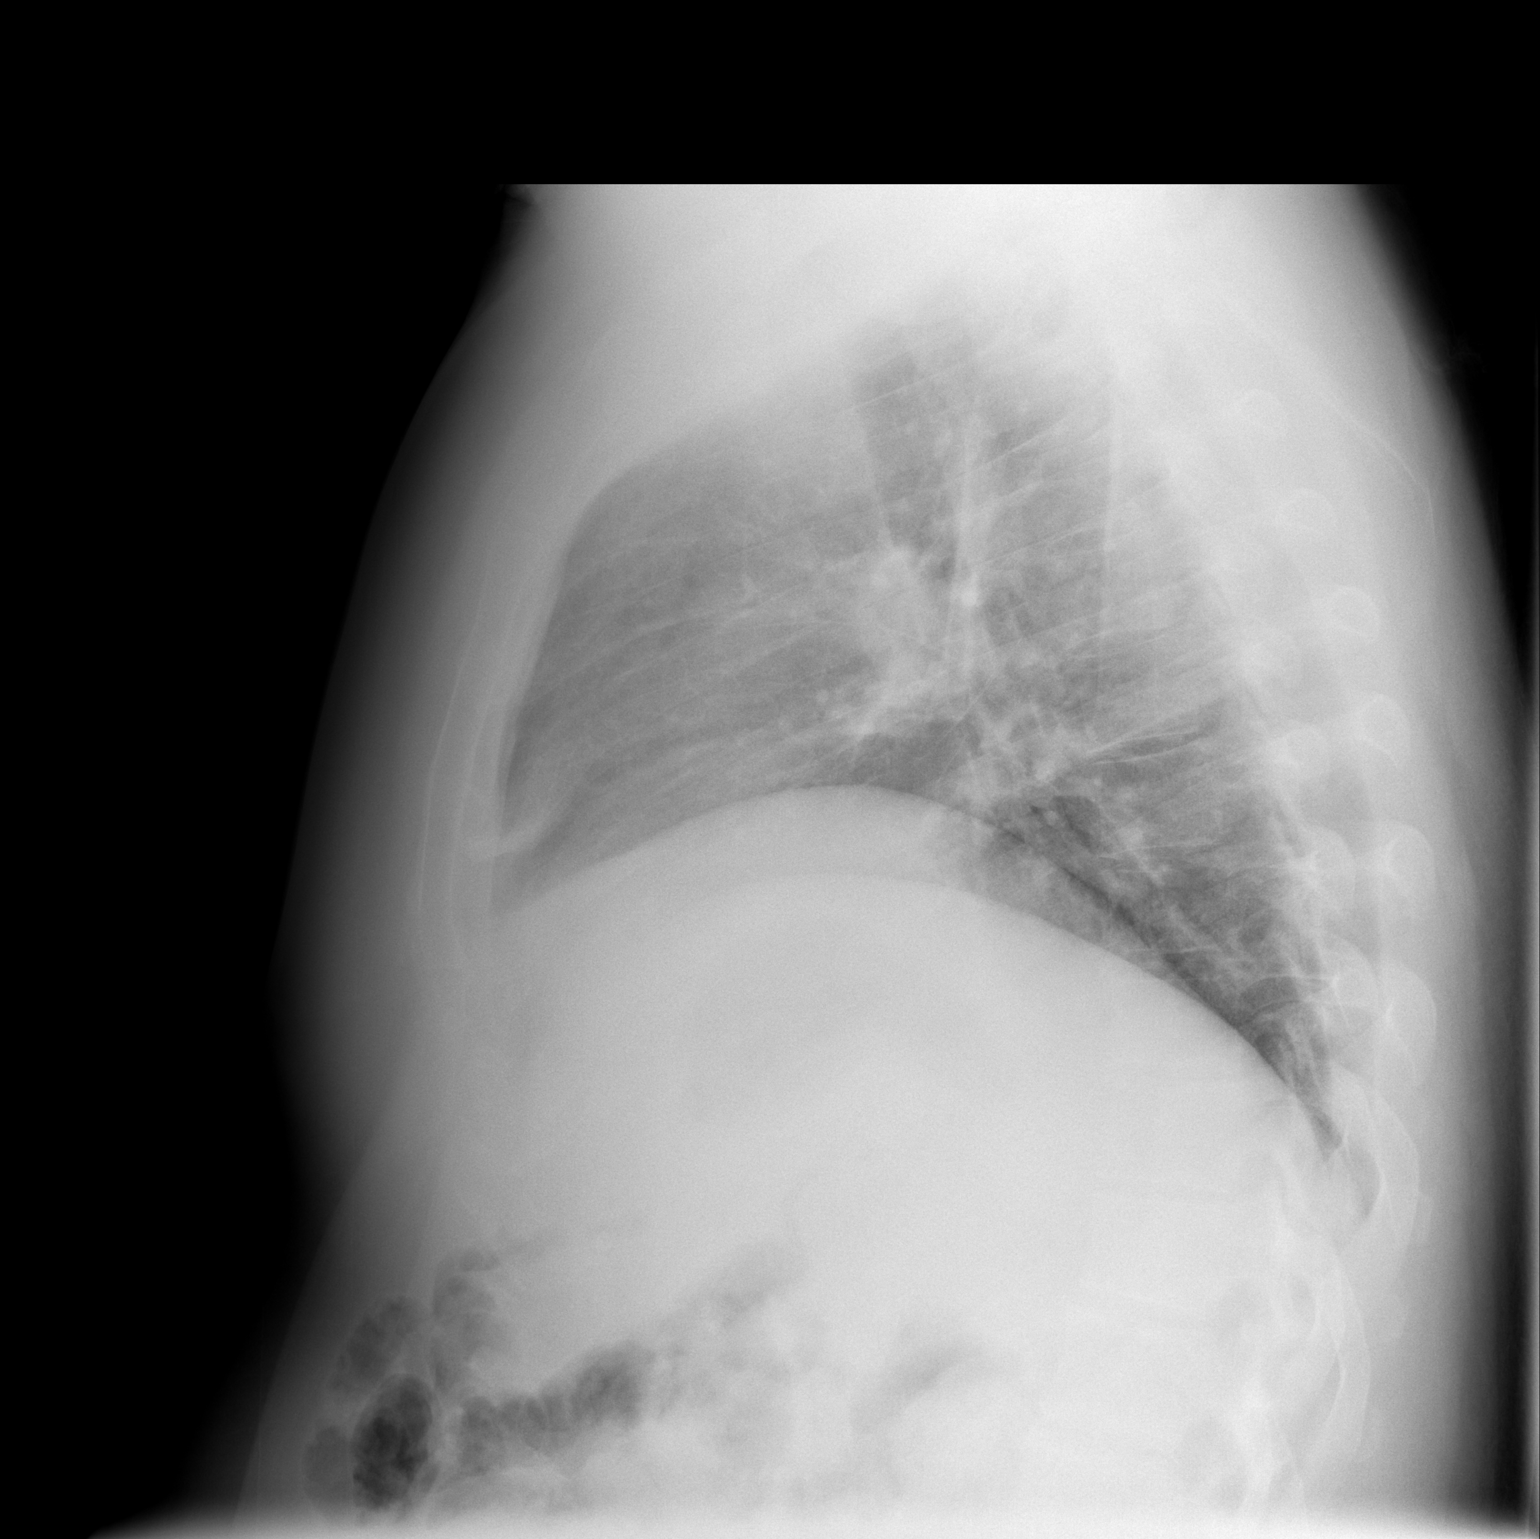

[2 of 2 positions shown; findings below may reference images not displayed]

FINDINGS: The heart size and mediastinal contours are within normal limits.
Low lung volumes are noted. Both lungs are clear. No mass or
lymphadenopathy identified. The visualized skeletal structures are
unremarkable.
IMPRESSION: No active cardiopulmonary disease.

## 2013-11-24 MED ORDER — KETOROLAC TROMETHAMINE 30 MG/ML IJ SOLN
30.0000 mg | Freq: Once | INTRAMUSCULAR | Status: AC
  Administered 2013-11-24: 30 mg via INTRAVENOUS
  Filled 2013-11-24: qty 1

## 2013-11-24 MED ORDER — OSELTAMIVIR PHOSPHATE 75 MG PO CAPS: 75.0000 mg | ORAL_CAPSULE | Freq: Two times a day (BID) | ORAL | Status: DC

## 2013-11-24 MED ORDER — SODIUM CHLORIDE 0.9 % IV BOLUS (SEPSIS)
1000.0000 mL | Freq: Once | INTRAVENOUS | Status: AC
  Administered 2013-11-24: 1000 mL via INTRAVENOUS

## 2013-11-24 NOTE — Discharge Instructions (Signed)
Return to the ED with any concerns including difficulty breathing, vomiting and not able to keep down liquids, decreased urine output, decreased level of alertness/lethargy, or any other alarming symptoms  °

## 2013-11-24 NOTE — ED Provider Notes (Signed)
CSN: [id number]     Arrival date & time 11/24/13  0911 History   First MD Initiated Contact with Patient 11/24/13 1023     Chief Complaint  Patient presents with  . Generalized Body Aches   (Consider location/radiation/quality/duration/timing/severity/associated sxs/prior Treatment) HPI Pt presents with c/o generalized body aches, subjective fever, cough, headache, sinus congestion.  Symptoms began abruptly yesterday.  No specific sick contacts.   Immunizations are up to date.  No recent travel. He did receive his flu shot.  No chest pain, no difficulty breathing.  No vomiting or diarrhea.  He has been drinking liquids normally.    Past Medical History  Diagnosis Date  . Lung cancer 2010  . Testicle cancer   . Bleeding ulcer   . Kidney stone   . Vertigo   . Gout   . OCD (obsessive compulsive disorder)   . Depression, major   . Panic attacks   . 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 Kidney stone surgery     No family history on file. History  Substance Use Topics  . Smoking status: Never Smoker   . Smokeless tobacco: Never Used  . Alcohol Use: No    Review of Systems ROS reviewed and all otherwise negative except for mentioned in HPI  Allergies  Review of patient's allergies indicates no known allergies.  Home Medications   Current Outpatient Rx  Name  Route  Sig  Dispense  Refill  . allopurinol (ZYLOPRIM) 300 MG tablet   Oral   Take 300 mg by mouth daily.         Marland Kitchen PRESCRIPTION MEDICATION      Pt takes a pain medication at night for neuropathy         . oseltamivir (TAMIFLU) 75 MG capsule   Oral   Take 1 capsule (75 mg total) by mouth every 12 (twelve) hours.   10 capsule   0    BP 148/90  Pulse 97  Temp(Src) 98.6 F (37 C) (Oral)  Resp 18  Ht 6' (1.829 m)  Wt 240 lb (108.863 kg)  BMI 32.54 kg/m2  SpO2 96% Vitals reviewed Physical Exam Physical Examination:  General appearance - alert, well appearing, and in no distress Mental status - alert, oriented to person, place, and time Eyes - no scleral icterus, no conjunctival injection Mouth - mucous membranes moist, pharynx normal without lesions Neck - supple, no significant adenopathy Chest - clear to auscultation, no wheezes, rales or rhonchi, symmetric air entry Heart - normal rate, regular rhythm, normal S1, S2, no murmurs, rubs, clicks or gallops Abdomen - soft, nontender, nondistended, no masses or organomegaly Extremities - peripheral pulses normal, no pedal edema, no clubbing or cyanosis Skin - normal coloration and turgor, no rashes  ED Course  Procedures (including critical care time) Labs Review Labs Reviewed - No data to display Imaging Review Dg Chest 2 View  11/24/2013   CLINICAL DATA:  Testicular and lung carcinoma.  EXAM: CHEST  2 VIEW  COMPARISON:  06/13/2009  FINDINGS: The heart size and mediastinal contours are within normal limits. Low lung volumes are noted. Both lungs are clear. No mass or lymphadenopathy identified. The visualized skeletal structures are unremarkable.  IMPRESSION: No active cardiopulmonary disease.   Electronically Signed   By: Myles Rosenthal M.D.   On: 11/24/2013 11:14    EKG Interpretation   None  MDM   1. Influenza-like illness   2. Tachycardia    Pt presents with cough, body aches, fever symptoms c/w influenza like illness. Pt feels improved after hydration in the ED.  Will start on tamiflu.  Discharged with strict return precautions.  Pt agreeable with plan.    Ethelda ChickMartha K Linker, MD 11/25/13 [phone]

## 2013-11-24 NOTE — ED Notes (Signed)
Patient states he has a one day history of generalized body aches, chills, headache, sinus congestion and drainage.  Used OTC allergy meds with no relief.

## 2013-11-24 NOTE — ED Notes (Signed)
Patient transported to X-ray 

## 2014-03-11 ENCOUNTER — Emergency Department (HOSPITAL_BASED_OUTPATIENT_CLINIC_OR_DEPARTMENT_OTHER)
Admission: EM | Admit: 2014-03-11 | Discharge: 2014-03-11 | Disposition: A | Discharge status: Home / Self Care | Payer: Self-pay | Attending: Emergency Medicine | Admitting: Emergency Medicine

## 2014-03-11 ENCOUNTER — Encounter (HOSPITAL_BASED_OUTPATIENT_CLINIC_OR_DEPARTMENT_OTHER): Payer: Self-pay | Admitting: Emergency Medicine

## 2014-03-11 DIAGNOSIS — R110 Nausea: Secondary | ICD-10-CM | POA: Insufficient documentation

## 2014-03-11 DIAGNOSIS — Z85118 Personal history of other malignant neoplasm of bronchus and lung: Secondary | ICD-10-CM | POA: Insufficient documentation

## 2014-03-11 DIAGNOSIS — Z8669 Personal history of other diseases of the nervous system and sense organs: Secondary | ICD-10-CM | POA: Insufficient documentation

## 2014-03-11 DIAGNOSIS — Z8719 Personal history of other diseases of the digestive system: Secondary | ICD-10-CM | POA: Insufficient documentation

## 2014-03-11 DIAGNOSIS — Z87442 Personal history of urinary calculi: Secondary | ICD-10-CM | POA: Insufficient documentation

## 2014-03-11 DIAGNOSIS — Z79899 Other long term (current) drug therapy: Secondary | ICD-10-CM | POA: Insufficient documentation

## 2014-03-11 DIAGNOSIS — F429 Obsessive-compulsive disorder, unspecified: Secondary | ICD-10-CM | POA: Insufficient documentation

## 2014-03-11 DIAGNOSIS — F410 Panic disorder [episodic paroxysmal anxiety] without agoraphobia: Secondary | ICD-10-CM | POA: Insufficient documentation

## 2014-03-11 DIAGNOSIS — J111 Influenza due to unidentified influenza virus with other respiratory manifestations: Secondary | ICD-10-CM | POA: Insufficient documentation

## 2014-03-11 DIAGNOSIS — Z8547 Personal history of malignant neoplasm of testis: Secondary | ICD-10-CM | POA: Insufficient documentation

## 2014-03-11 DIAGNOSIS — F329 Major depressive disorder, single episode, unspecified: Secondary | ICD-10-CM | POA: Insufficient documentation

## 2014-03-11 DIAGNOSIS — R6889 Other general symptoms and signs: Secondary | ICD-10-CM

## 2014-03-11 DIAGNOSIS — M109 Gout, unspecified: Secondary | ICD-10-CM | POA: Insufficient documentation

## 2014-03-11 DIAGNOSIS — R000 Tachycardia, unspecified: Secondary | ICD-10-CM | POA: Insufficient documentation

## 2014-03-11 HISTORY — DX: Polyneuropathy, unspecified: G62.9

## 2014-03-11 LAB — BASIC METABOLIC PANEL
BUN: 11 mg/dL (ref 6–23)
CALCIUM: 9.4 mg/dL (ref 8.4–10.5)
CO2: 25 meq/L (ref 19–32)
CREATININE: 1.2 mg/dL (ref 0.50–1.35)
Chloride: 106 mEq/L (ref 96–112)
GFR calc Af Amer: 89 mL/min — ABNORMAL LOW (ref >90)
GFR, EST NON AFRICAN AMERICAN: 76 mL/min — AB (ref >90)
GLUCOSE: 118 mg/dL — AB (ref 70–99)
Potassium: 4.1 mEq/L (ref 3.7–5.3)
SODIUM: 144 meq/L (ref 137–147)

## 2014-03-11 LAB — CBC WITH DIFFERENTIAL/PLATELET
Basophils Absolute: 0.1 10*3/uL (ref 0.0–0.1)
Basophils Relative: 0 % (ref 0–1)
EOS ABS: 0.2 10*3/uL (ref 0.0–0.7)
EOS PCT: 2 % (ref 0–5)
HEMATOCRIT: 47.6 % (ref 39.0–52.0)
Hemoglobin: 16.6 g/dL (ref 13.0–17.0)
LYMPHS ABS: 3.7 10*3/uL (ref 0.7–4.0)
Lymphocytes Relative: 32 % (ref 12–46)
MCH: 30.6 pg (ref 26.0–34.0)
MCHC: 34.9 g/dL (ref 30.0–36.0)
MCV: 87.8 fL (ref 78.0–100.0)
MONO ABS: 1.5 10*3/uL — AB (ref 0.1–1.0)
Monocytes Relative: 13 % — ABNORMAL HIGH (ref 3–12)
Neutro Abs: 6.2 10*3/uL (ref 1.7–7.7)
Neutrophils Relative %: 53 % (ref 43–77)
PLATELETS: 257 10*3/uL (ref 150–400)
RBC: 5.42 MIL/uL (ref 4.22–5.81)
RDW: 13.3 % (ref 11.5–15.5)
WBC: 11.7 10*3/uL — ABNORMAL HIGH (ref 4.0–10.5)

## 2014-03-11 MED ORDER — SODIUM CHLORIDE 0.9 % IV BOLUS (SEPSIS)
250.0000 mL | Freq: Once | INTRAVENOUS | Status: AC
  Administered 2014-03-11: 08:00:00 via INTRAVENOUS

## 2014-03-11 MED ORDER — ONDANSETRON HCL 4 MG/2ML IJ SOLN
4.0000 mg | Freq: Once | INTRAMUSCULAR | Status: AC
  Administered 2014-03-11: 4 mg via INTRAVENOUS
  Filled 2014-03-11: qty 2

## 2014-03-11 MED ORDER — NAPROXEN 500 MG PO TABS: 500.0000 mg | ORAL_TABLET | Freq: Two times a day (BID) | ORAL | Status: DC

## 2014-03-11 MED ORDER — SODIUM CHLORIDE 0.9 % IV SOLN
INTRAVENOUS | Status: DC
  Administered 2014-03-11: 08:00:00 via INTRAVENOUS

## 2014-03-11 MED ORDER — KETOROLAC TROMETHAMINE 30 MG/ML IJ SOLN
30.0000 mg | Freq: Once | INTRAMUSCULAR | Status: AC
  Administered 2014-03-11: 30 mg via INTRAVENOUS
  Filled 2014-03-11: qty 1

## 2014-03-11 MED ORDER — SODIUM CHLORIDE 0.9 % IV SOLN: INTRAVENOUS | Status: DC

## 2014-03-11 NOTE — Discharge Instructions (Signed)
Symptoms seem to be consistent with a viral flulike illness or upper respiratory illness. Take the Naprosyn for the bodyaches. Return for any newer worse symptoms. Continue to hydrate yourself well. Recommend Gatorade and water.

## 2014-03-11 NOTE — ED Notes (Signed)
Pt complaining of bilateral leg pain from neuropathy. Pt also states he was diagnosed with flu several months ago and feels the same way today. Chills but no fever.

## 2014-03-11 NOTE — ED Provider Notes (Signed)
CSN: [id number]     Arrival date & time 03/11/14  [id number] History   First MD Initiated Contact with Patient 03/11/14 [phone]     Chief Complaint  Patient presents with  . Leg Pain     (Consider location/radiation/quality/duration/timing/severity/associated sxs/prior Treatment) Patient is a 38 y.o. male presenting with leg pain. The history is provided by the patient.  Leg Pain Associated symptoms: back pain and fever    patient with long-standing history of leg neuropathy. Patient presents with congestion fever chills feeling no cough. Nausea. Bodyaches.  Past Medical History  Diagnosis Date  . Lung cancer 2010  . Testicle cancer   . Bleeding ulcer   . Kidney stone   . Vertigo   . Gout   . OCD (obsessive compulsive disorder)   . Depression, major   . Panic attacks   . 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 Neuropathy    Past Surgical History  Procedure Laterality Date  . Kidney stone surgery    . Testicular tumor      removal  . Lung tumor      removal   No family history on file. History  Substance Use Topics  . Smoking status: Never Smoker   . Smokeless tobacco: Never Used  . Alcohol Use: No    Review of Systems  Constitutional: Positive for fever and chills.  HENT: Positive for congestion.   Eyes: Negative for redness.  Respiratory: Negative for shortness of breath.   Cardiovascular: Negative for chest pain.  Gastrointestinal: Positive for nausea. Negative for abdominal pain.  Genitourinary: Negative for hematuria.  Musculoskeletal: Positive for back pain and myalgias.  Skin: Negative for rash.  Neurological: Negative for headaches.  Hematological: Does not bruise/bleed easily.  Psychiatric/Behavioral: Negative for confusion.      Allergies  Review of patient's allergies indicates no known allergies.  Home Medications   Prior to Admission medications   Medication Sig Start Date End Date Taking? Authorizing Provider   allopurinol (ZYLOPRIM) 300 MG tablet Take 300 mg by mouth daily.    Yes Historical Provider, MD  citalopram (CELEXA) 10 MG tablet Take 10 mg by mouth daily.   Yes Historical Provider, MD  Cyanocobalamin (VITAMIN B-12) 1000 MCG SUBL Place under the tongue. Takes 1 shot every two weeks.   Yes Historical Provider, MD  PRESCRIPTION MEDICATION Pt takes a pain medication at night for neuropathy   Yes Historical Provider, MD  oseltamivir (TAMIFLU) 75 MG capsule Take 1 capsule (75 mg total) by mouth every 12 (twelve) hours. 11/24/13   Ethelda ChickMartha K Linker, MD   BP 175/102  Pulse 103  Temp(Src) 98.8 F (37.1 C) (Oral)  Resp 20  Ht 6' (1.829 m)  Wt 240 lb (108.863 kg)  BMI 32.54 kg/m2  SpO2 98% Physical Exam  Nursing note and vitals reviewed. Constitutional: He is oriented to person, place, and time. He appears well-developed and well-nourished. No distress.  HENT:  Head: Normocephalic and atraumatic.  Mouth/Throat: Oropharynx is clear and moist.  Eyes: Conjunctivae and EOM are normal. Pupils are equal, round, and reactive to light.  Neck: Normal range of motion.  Cardiovascular: Normal rate, regular rhythm and normal heart sounds.   Slightly tachycardic  Pulmonary/Chest: Effort normal and breath sounds normal.  Abdominal: Soft. Bowel sounds are normal. There is no tenderness.  Musculoskeletal: Normal range of motion. He exhibits no edema.  Neurological: He is alert and oriented to person, place, and time. No cranial  nerve deficit. He exhibits normal muscle tone. Coordination normal.  Skin: Skin is warm. No rash noted.    ED Course  Procedures (including critical care time) Labs Review Labs Reviewed  CBC WITH DIFFERENTIAL  BASIC METABOLIC PANEL   Results for orders placed during the hospital encounter of 03/11/14  CBC WITH DIFFERENTIAL      Result Value Ref Range   WBC 11.7 (*) 4.0 - 10.5 K/uL   RBC 5.42  4.22 - 5.81 MIL/uL   Hemoglobin 16.6  13.0 - 17.0 g/dL   HCT 16.147.6  09.639.0 - 04.552.0 %    MCV 87.8  78.0 - 100.0 fL   MCH 30.6  26.0 - 34.0 pg   MCHC 34.9  30.0 - 36.0 g/dL   RDW 40.913.3  81.111.5 - 91.415.5 %   Platelets 257  150 - 400 K/uL   Neutrophils Relative % 53  43 - 77 %   Neutro Abs 6.2  1.7 - 7.7 K/uL   Lymphocytes Relative 32  12 - 46 %   Lymphs Abs 3.7  0.7 - 4.0 K/uL   Monocytes Relative 13 (*) 3 - 12 %   Monocytes Absolute 1.5 (*) 0.1 - 1.0 K/uL   Eosinophils Relative 2  0 - 5 %   Eosinophils Absolute 0.2  0.0 - 0.7 K/uL   Basophils Relative 0  0 - 1 %   Basophils Absolute 0.1  0.0 - 0.1 K/uL  BASIC METABOLIC PANEL      Result Value Ref Range   Sodium 144  137 - 147 mEq/L   Potassium 4.1  3.7 - 5.3 mEq/L   Chloride 106  96 - 112 mEq/L   CO2 25  19 - 32 mEq/L   Glucose, Bld 118 (*) 70 - 99 mg/dL   BUN 11  6 - 23 mg/dL   Creatinine, Ser 7.821.20  0.50 - 1.35 mg/dL   Calcium 9.4  8.4 - 95.610.5 mg/dL   GFR calc non Af Amer 76 (*) >90 mL/min   GFR calc Af Amer 89 (*) >90 mL/min     Imaging Review No results found.   EKG Interpretation None      MDM   Final diagnoses:  Flu-like symptoms    Patient states he has flulike symptoms. Similarly had in January. He is bodyaches no sore throat fevers feeling chills congestion nausea but no vomiting. Patient slightly tachycardic here but on monitor her heart rate will vary from 90s to low 100s. Patient states she's had a history of anemia in the past so CBC will be checked.  Patient's labs without significant abnormalities. Heart rate at rest goes down to 88. Patient hydrated with normal saline. Over treated with Naprosyn for the bodyaches. Followup with primary care Dr. for concerns for the persistent neuropathy.    Shelda JakesScott W. Cali Hope, MD 03/11/14 [phone]

## 2016-12-11 ENCOUNTER — Encounter (HOSPITAL_BASED_OUTPATIENT_CLINIC_OR_DEPARTMENT_OTHER): Payer: Self-pay | Admitting: *Deleted

## 2016-12-11 ENCOUNTER — Emergency Department (HOSPITAL_BASED_OUTPATIENT_CLINIC_OR_DEPARTMENT_OTHER)
Admission: EM | Admit: 2016-12-11 | Discharge: 2016-12-11 | Disposition: A | Discharge status: Home / Self Care | Payer: Self-pay | Attending: Emergency Medicine | Admitting: Emergency Medicine

## 2016-12-11 DIAGNOSIS — R69 Illness, unspecified: Secondary | ICD-10-CM

## 2016-12-11 DIAGNOSIS — R067 Sneezing: Secondary | ICD-10-CM | POA: Insufficient documentation

## 2016-12-11 DIAGNOSIS — R509 Fever, unspecified: Secondary | ICD-10-CM | POA: Insufficient documentation

## 2016-12-11 DIAGNOSIS — J111 Influenza due to unidentified influenza virus with other respiratory manifestations: Secondary | ICD-10-CM

## 2016-12-11 DIAGNOSIS — M791 Myalgia: Secondary | ICD-10-CM | POA: Insufficient documentation

## 2016-12-11 DIAGNOSIS — R0981 Nasal congestion: Secondary | ICD-10-CM | POA: Insufficient documentation

## 2016-12-11 DIAGNOSIS — Z79899 Other long term (current) drug therapy: Secondary | ICD-10-CM | POA: Insufficient documentation

## 2016-12-11 DIAGNOSIS — R05 Cough: Secondary | ICD-10-CM | POA: Insufficient documentation

## 2016-12-11 HISTORY — DX: Other specified disorders of the male genital organs: N50.89

## 2016-12-11 HISTORY — DX: Solitary pulmonary nodule: R91.1

## 2016-12-11 MED ORDER — KETOROLAC TROMETHAMINE 30 MG/ML IJ SOLN
30.0000 mg | Freq: Once | INTRAMUSCULAR | Status: AC
  Administered 2016-12-11: 30 mg via INTRAVENOUS
  Filled 2016-12-11: qty 1

## 2016-12-11 MED ORDER — NAPROXEN 500 MG PO TABS: 500.0000 mg | ORAL_TABLET | Freq: Two times a day (BID) | ORAL | 0 refills | Status: DC

## 2016-12-11 MED ORDER — OSELTAMIVIR PHOSPHATE 75 MG PO CAPS: 75.0000 mg | ORAL_CAPSULE | Freq: Two times a day (BID) | ORAL | 0 refills | Status: DC

## 2016-12-11 MED ORDER — SODIUM CHLORIDE 0.9 % IV BOLUS (SEPSIS)
500.0000 mL | Freq: Once | INTRAVENOUS | Status: AC
  Administered 2016-12-11: 500 mL via INTRAVENOUS

## 2016-12-11 NOTE — ED Provider Notes (Signed)
[facility] Provider Note   CSN: [id number] Arrival date & time: 12/11/16  0434     History   Chief Complaint Chief Complaint  Patient presents with  . Cough  . Generalized Body Aches    HPI Gene Hansen is a 41 y.o. male.  The history is provided by the patient.  URI   This is a new problem. The current episode started more than 2 days ago (3.5). Maximum temperature: subjective. Associated symptoms include congestion, sneezing and cough. Pertinent negatives include no chest pain, no abdominal pain, no diarrhea, no vomiting, no dysuria, no sore throat, no neck pain and no rash. Associated symptoms comments: Dry cough watering eyes and body aches. Treatments tried: tylenol. The treatment provided no relief.  Muscle Pain  This is a new problem. The current episode started more than 2 days ago. The problem occurs constantly. The problem has not changed since onset.Pertinent negatives include no chest pain, no abdominal pain and no shortness of breath. Nothing aggravates the symptoms. Nothing relieves the symptoms. He has tried acetaminophen for the symptoms. The treatment provided no relief.  Was coughed on while out 3.5 days ago.  Now has dry cough, muscle aches, subjective fevers.    Past Medical History:  Diagnosis Date  . Bleeding ulcer   . Depression, major   . Gout   . Kidney stone   . Lung nodule   . Neuropathy (HCC)   . Neuropathy, lower extremity    bilateral lower extremities and feet  . OCD (obsessive compulsive disorder)   . Panic attacks   . Testicular mass   . Vertigo     There are no active problems to display for this patient.   Past Surgical History:  Procedure Laterality Date  . KIDNEY STONE SURGERY    . lung tumor     removal  . testicular tumor     removal       Home Medications    Prior to Admission medications   Medication Sig Start Date End Date Taking? Authorizing Provider  cetirizine (ZYRTEC) 10 MG tablet Take 10 mg by  mouth daily.   Yes Historical Provider, MD  allopurinol (ZYLOPRIM) 300 MG tablet Take 300 mg by mouth daily.     Historical Provider, MD  citalopram (CELEXA) 10 MG tablet Take 10 mg by mouth daily.    Historical Provider, MD    Family History History reviewed. No pertinent family history.  Social History Social History  Substance Use Topics  . Smoking status: Never Smoker  . Smokeless tobacco: Never Used  . Alcohol use No     Allergies   Patient has no known allergies.   Review of Systems Review of Systems  Constitutional: Positive for fever.  HENT: Positive for congestion and sneezing. Negative for sore throat.   Respiratory: Positive for cough. Negative for shortness of breath.   Cardiovascular: Negative for chest pain, palpitations and leg swelling.  Gastrointestinal: Negative for abdominal pain, constipation, diarrhea and vomiting.  Genitourinary: Negative for dysuria.  Musculoskeletal: Positive for myalgias. Negative for joint swelling, neck pain and neck stiffness.  Skin: Negative for rash.  All other systems reviewed and are negative.    Physical Exam Updated Vital Signs BP (!) 151/122 (BP Location: Right Arm)   Pulse 112   Temp 99.9 F (37.7 C) (Oral)   Resp 20   Ht 6' (1.829 m)   Wt 246 lb (111.6 kg)   SpO2 95%   BMI 33.36  kg/m   Physical Exam  Constitutional: He is oriented to person, place, and time. He appears well-developed and well-nourished.  HENT:  Head: Normocephalic and atraumatic.  Mouth/Throat: No oropharyngeal exudate.  Eyes: EOM are normal. Pupils are equal, round, and reactive to light.  Neck: Normal range of motion. Neck supple. No JVD present.  Cardiovascular: Normal rate, regular rhythm, normal heart sounds and intact distal pulses.   Pulmonary/Chest: Effort normal and breath sounds normal. No respiratory distress. He has no wheezes. He has no rales.  Abdominal: Soft. Bowel sounds are normal. He exhibits no mass. There is no  tenderness. There is no rebound and no guarding.  Musculoskeletal: Normal range of motion. He exhibits no edema, tenderness or deformity.  Lymphadenopathy:    He has no cervical adenopathy.  Neurological: He is alert and oriented to person, place, and time.  Skin: Skin is warm and dry. Capillary refill takes less than 2 seconds.  Psychiatric: He has a normal mood and affect.     ED Treatments / Results   Vitals:   12/11/16 0451  BP: (!) 151/122  Pulse: 112  Resp: 20  Temp: 99.9 F (37.7 C)    Procedures Procedures (including critical care time)  Medications Ordered in ED Medications  sodium chloride 0.9 % bolus 500 mL (not administered)  ketorolac (TORADOL) 30 MG/ML injection 30 mg (not administered)     Final Clinical Impressions(s) / ED Diagnoses  Flu like illness: All questions answered to patient's satisfaction. Based on history and exam patient has been appropriately medically screened and emergency conditions excluded. Patient is stable for discharge at this time. Strict return precautions given for any further episodes, persistent fever, weakness or any concerns.    Cy Blamer, MD 12/11/16 [phone]

## 2016-12-11 NOTE — ED Triage Notes (Signed)
Pt presents with cough congestion and body aches x 3 days.

## 2016-12-13 ENCOUNTER — Inpatient Hospital Stay (HOSPITAL_BASED_OUTPATIENT_CLINIC_OR_DEPARTMENT_OTHER)
Admission: EM | Admit: 2016-12-13 | Discharge: 2016-12-15 | DRG: 193 | Disposition: A | Discharge status: Home / Self Care | Payer: Self-pay | Attending: Internal Medicine | Admitting: Internal Medicine

## 2016-12-13 ENCOUNTER — Emergency Department (HOSPITAL_BASED_OUTPATIENT_CLINIC_OR_DEPARTMENT_OTHER): Payer: Self-pay

## 2016-12-13 ENCOUNTER — Encounter (HOSPITAL_BASED_OUTPATIENT_CLINIC_OR_DEPARTMENT_OTHER): Payer: Self-pay | Admitting: *Deleted

## 2016-12-13 ENCOUNTER — Observation Stay (HOSPITAL_COMMUNITY): Payer: Self-pay

## 2016-12-13 DIAGNOSIS — I272 Pulmonary hypertension, unspecified: Secondary | ICD-10-CM | POA: Diagnosis present

## 2016-12-13 DIAGNOSIS — R062 Wheezing: Secondary | ICD-10-CM

## 2016-12-13 DIAGNOSIS — F429 Obsessive-compulsive disorder, unspecified: Secondary | ICD-10-CM | POA: Diagnosis present

## 2016-12-13 DIAGNOSIS — R05 Cough: Secondary | ICD-10-CM

## 2016-12-13 DIAGNOSIS — Z8711 Personal history of peptic ulcer disease: Secondary | ICD-10-CM

## 2016-12-13 DIAGNOSIS — J9601 Acute respiratory failure with hypoxia: Secondary | ICD-10-CM | POA: Diagnosis present

## 2016-12-13 DIAGNOSIS — E872 Acidosis, unspecified: Secondary | ICD-10-CM | POA: Diagnosis present

## 2016-12-13 DIAGNOSIS — M109 Gout, unspecified: Secondary | ICD-10-CM | POA: Diagnosis present

## 2016-12-13 DIAGNOSIS — Z791 Long term (current) use of non-steroidal anti-inflammatories (NSAID): Secondary | ICD-10-CM

## 2016-12-13 DIAGNOSIS — R0902 Hypoxemia: Secondary | ICD-10-CM

## 2016-12-13 DIAGNOSIS — J111 Influenza due to unidentified influenza virus with other respiratory manifestations: Secondary | ICD-10-CM

## 2016-12-13 DIAGNOSIS — F410 Panic disorder [episodic paroxysmal anxiety] without agoraphobia: Secondary | ICD-10-CM | POA: Diagnosis present

## 2016-12-13 DIAGNOSIS — F418 Other specified anxiety disorders: Secondary | ICD-10-CM | POA: Diagnosis present

## 2016-12-13 DIAGNOSIS — G629 Polyneuropathy, unspecified: Secondary | ICD-10-CM | POA: Diagnosis present

## 2016-12-13 DIAGNOSIS — J101 Influenza due to other identified influenza virus with other respiratory manifestations: Principal | ICD-10-CM | POA: Diagnosis present

## 2016-12-13 DIAGNOSIS — R059 Cough, unspecified: Secondary | ICD-10-CM

## 2016-12-13 DIAGNOSIS — R0603 Acute respiratory distress: Secondary | ICD-10-CM

## 2016-12-13 DIAGNOSIS — N179 Acute kidney failure, unspecified: Secondary | ICD-10-CM | POA: Diagnosis present

## 2016-12-13 DIAGNOSIS — I10 Essential (primary) hypertension: Secondary | ICD-10-CM | POA: Diagnosis present

## 2016-12-13 DIAGNOSIS — J209 Acute bronchitis, unspecified: Secondary | ICD-10-CM | POA: Diagnosis present

## 2016-12-13 DIAGNOSIS — R69 Illness, unspecified: Secondary | ICD-10-CM

## 2016-12-13 LAB — URINALYSIS, ROUTINE W REFLEX MICROSCOPIC
BILIRUBIN URINE: NEGATIVE
Glucose, UA: NEGATIVE mg/dL
HGB URINE DIPSTICK: NEGATIVE
Ketones, ur: NEGATIVE mg/dL
Leukocytes, UA: NEGATIVE
NITRITE: NEGATIVE
PROTEIN: NEGATIVE mg/dL
SPECIFIC GRAVITY, URINE: 1.017 (ref 1.005–1.030)
pH: 6 (ref 5.0–8.0)

## 2016-12-13 LAB — BLOOD GAS, ARTERIAL
Acid-base deficit: 3.7 mmol/L — ABNORMAL HIGH (ref 0.0–2.0)
BICARBONATE: 20.2 mmol/L (ref 20.0–28.0)
Drawn by: 313061
O2 Content: 2 L/min
O2 Saturation: 92.9 %
PO2 ART: 67.1 mmHg — AB (ref 83.0–108.0)
Patient temperature: 98.6
pCO2 arterial: 33.1 mmHg (ref 32.0–48.0)
pH, Arterial: 7.403 (ref 7.350–7.450)

## 2016-12-13 LAB — CBC
HCT: 45.4 % (ref 39.0–52.0)
HEMOGLOBIN: 15.5 g/dL (ref 13.0–17.0)
MCH: 30.4 pg (ref 26.0–34.0)
MCHC: 34.1 g/dL (ref 30.0–36.0)
MCV: 89 fL (ref 78.0–100.0)
Platelets: 206 10*3/uL (ref 150–400)
RBC: 5.1 MIL/uL (ref 4.22–5.81)
RDW: 13.7 % (ref 11.5–15.5)
WBC: 3.6 10*3/uL — ABNORMAL LOW (ref 4.0–10.5)

## 2016-12-13 LAB — CBC WITH DIFFERENTIAL/PLATELET
BASOS PCT: 1 %
Basophils Absolute: 0 10*3/uL (ref 0.0–0.1)
Eosinophils Absolute: 0.2 10*3/uL (ref 0.0–0.7)
Eosinophils Relative: 2 %
HEMATOCRIT: 45.9 % (ref 39.0–52.0)
HEMOGLOBIN: 15.6 g/dL (ref 13.0–17.0)
LYMPHS ABS: 2.9 10*3/uL (ref 0.7–4.0)
Lymphocytes Relative: 43 %
MCH: 30.1 pg (ref 26.0–34.0)
MCHC: 34 g/dL (ref 30.0–36.0)
MCV: 88.4 fL (ref 78.0–100.0)
MONO ABS: 1.2 10*3/uL — AB (ref 0.1–1.0)
MONOS PCT: 18 %
NEUTROS ABS: 2.4 10*3/uL (ref 1.7–7.7)
Neutrophils Relative %: 36 %
Platelets: 188 10*3/uL (ref 150–400)
RBC: 5.19 MIL/uL (ref 4.22–5.81)
RDW: 14 % (ref 11.5–15.5)
WBC: 6.6 10*3/uL (ref 4.0–10.5)

## 2016-12-13 LAB — COMPREHENSIVE METABOLIC PANEL
ALBUMIN: 4.1 g/dL (ref 3.5–5.0)
ALK PHOS: 67 U/L (ref 38–126)
ALT: 29 U/L (ref 17–63)
ANION GAP: 9 (ref 5–15)
AST: 29 U/L (ref 15–41)
BILIRUBIN TOTAL: 0.5 mg/dL (ref 0.3–1.2)
BUN: 13 mg/dL (ref 6–20)
CALCIUM: 8.6 mg/dL — AB (ref 8.9–10.3)
CO2: 24 mmol/L (ref 22–32)
Chloride: 104 mmol/L (ref 101–111)
Creatinine, Ser: 1.17 mg/dL (ref 0.61–1.24)
GLUCOSE: 111 mg/dL — AB (ref 65–99)
POTASSIUM: 3.4 mmol/L — AB (ref 3.5–5.1)
Sodium: 137 mmol/L (ref 135–145)
TOTAL PROTEIN: 7.1 g/dL (ref 6.5–8.1)

## 2016-12-13 LAB — CREATININE, SERUM
CREATININE: 1.46 mg/dL — AB (ref 0.61–1.24)
GFR calc Af Amer: >60 mL/min (ref >60)
GFR calc non Af Amer: 59 mL/min — ABNORMAL LOW (ref >60)

## 2016-12-13 LAB — PROCALCITONIN: Procalcitonin: <0.1 ng/mL

## 2016-12-13 LAB — I-STAT CG4 LACTIC ACID, ED: LACTIC ACID, VENOUS: 3.09 mmol/L — AB (ref 0.5–1.9)

## 2016-12-13 LAB — INFLUENZA PANEL BY PCR (TYPE A & B)
Influenza A By PCR: POSITIVE — AB
Influenza B By PCR: NEGATIVE

## 2016-12-13 LAB — LACTIC ACID, PLASMA
LACTIC ACID, VENOUS: 5.4 mmol/L — AB (ref 0.5–1.9)
Lactic Acid, Venous: 4.6 mmol/L (ref 0.5–1.9)

## 2016-12-13 LAB — MAGNESIUM: Magnesium: 2.2 mg/dL (ref 1.7–2.4)

## 2016-12-13 IMAGING — CT CT ANGIO CHEST
2 of 8 series · 19 of 36 positions shown · IV contrast (isovue)
Comparison: Plain films of [DATE].  No prior CT.

CLINICAL DATA: Wheezing. Recently diagnosed with flu. Improved
cough. Short of breath. Chest pain. Back pain. Hypoxia.

EXAM:
CT ANGIOGRAPHY CHEST WITH CONTRAST
TECHNIQUE: Multidetector CT imaging of the chest was performed using the
standard protocol during bolus administration of intravenous
contrast. Multiplanar CT image reconstructions and MIPs were
obtained to evaluate the vascular anatomy.
CONTRAST:  100 cc of Isovue 370

[Series 6: pe coronal mpr · coronal · 0.64mm/px · 1 of 134 slices shown]
[im 67/134  mediastinal]
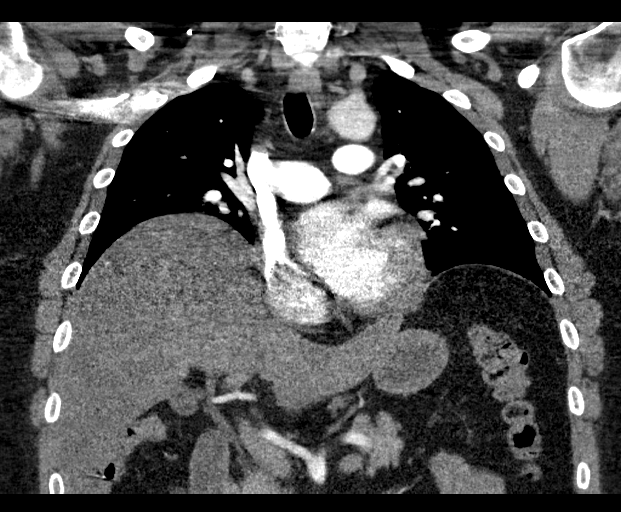

[Series 10: pe thins · axial · 0.76mm/px · z∈[-323,-52]mm · 18 of 303 slices shown]
[im 16/303  lung]
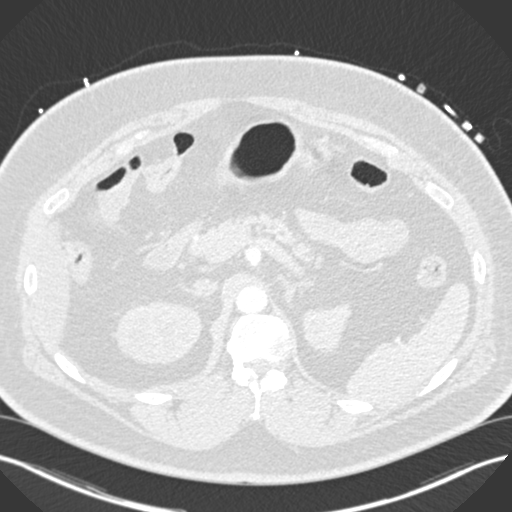
[im 32/303  mediastinal]
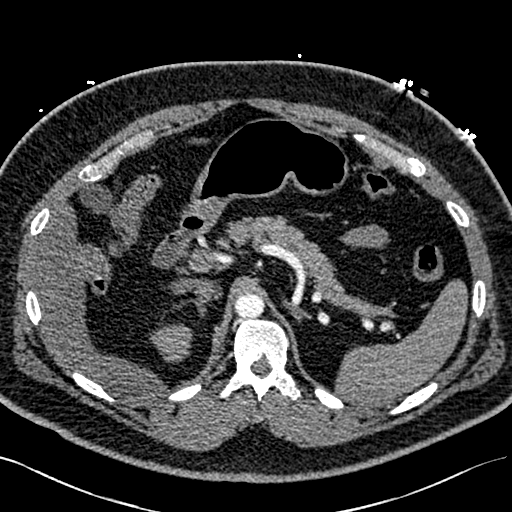
[im 48/303  lung]
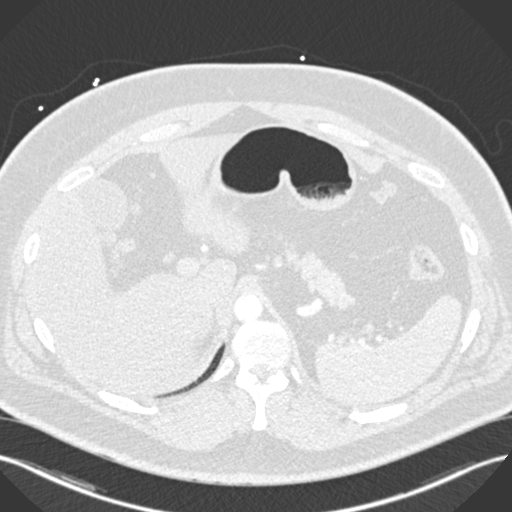
[im 64/303  mediastinal]
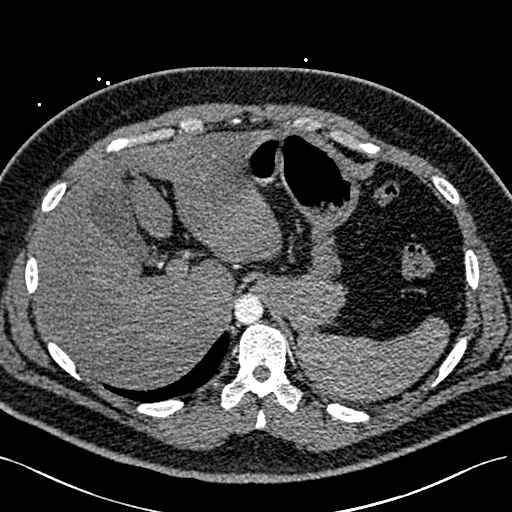
[im 80/303  lung]
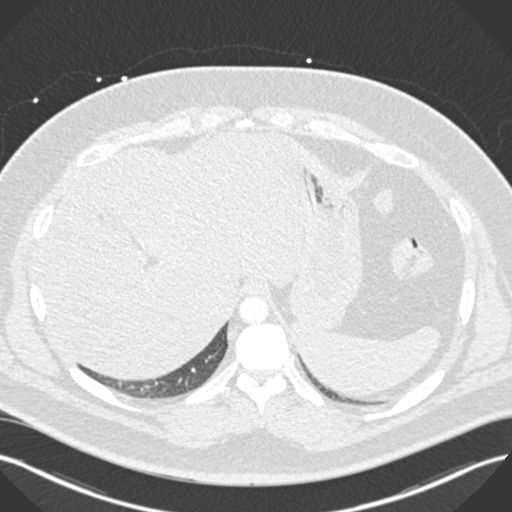
[im 96/303  mediastinal]
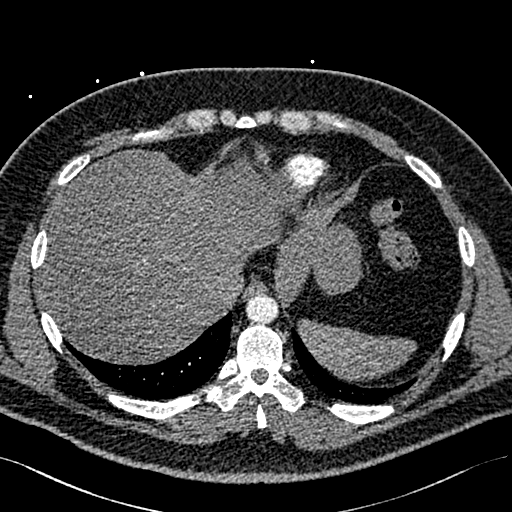
[im 112/303  lung]
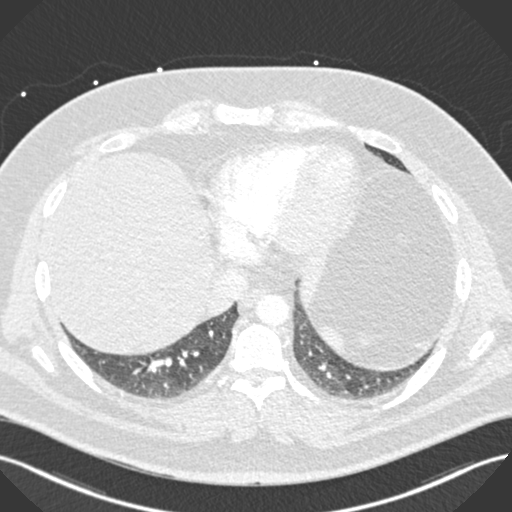
[im 128/303  mediastinal]
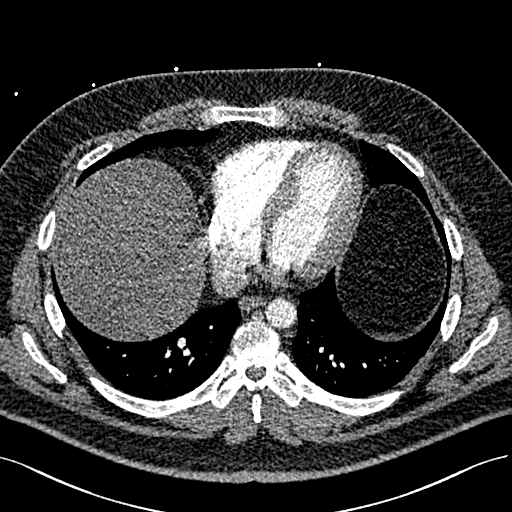
[im 144/303  lung]
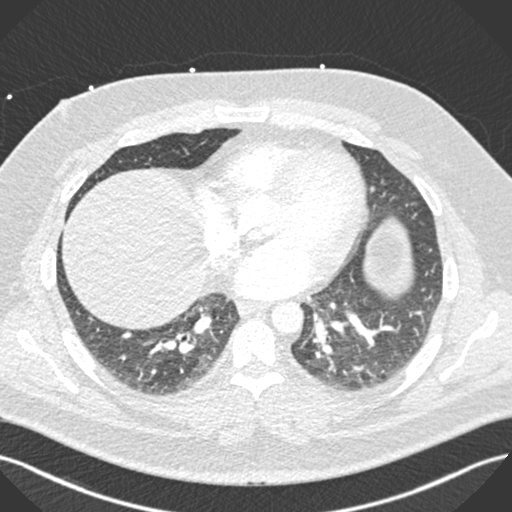
[im 159/303  mediastinal]
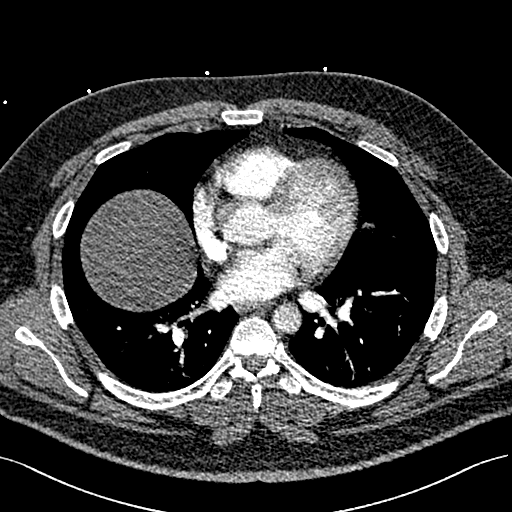
[im 175/303  lung]
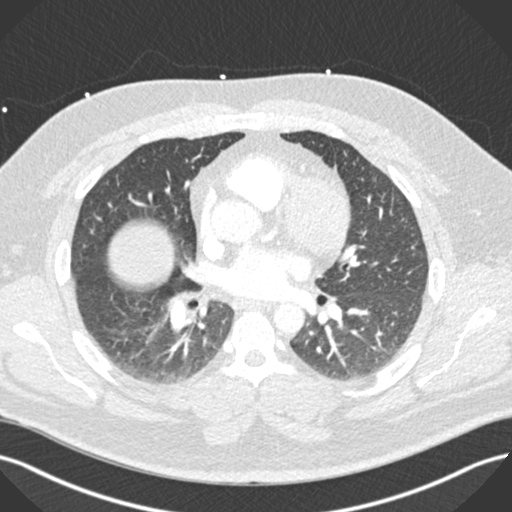
[im 191/303  mediastinal]
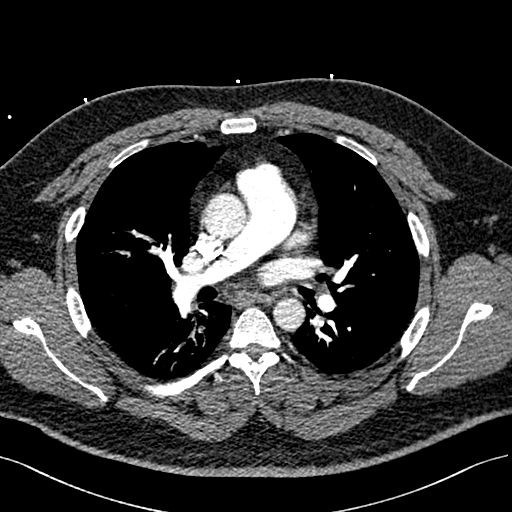
[im 207/303  lung]
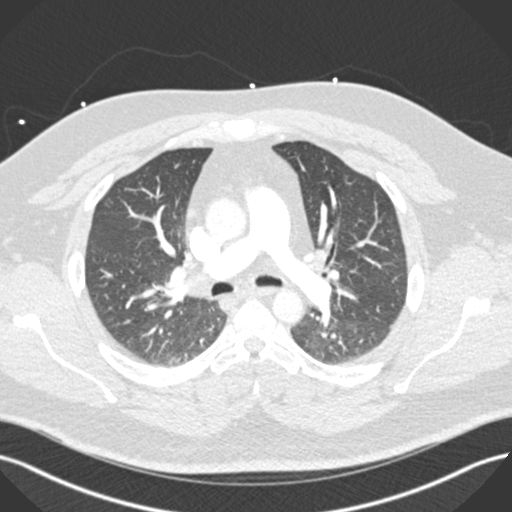
[im 223/303  mediastinal]
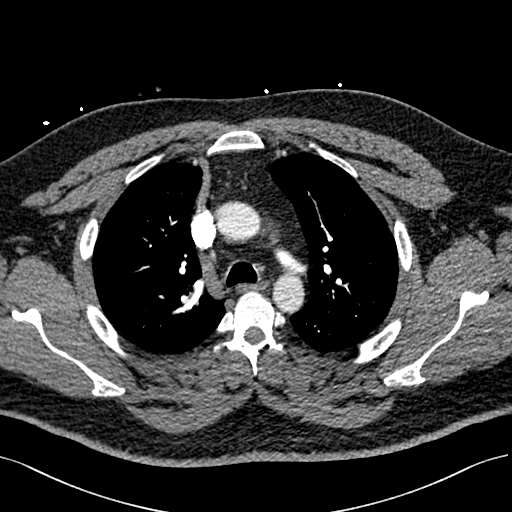
[im 239/303  lung]
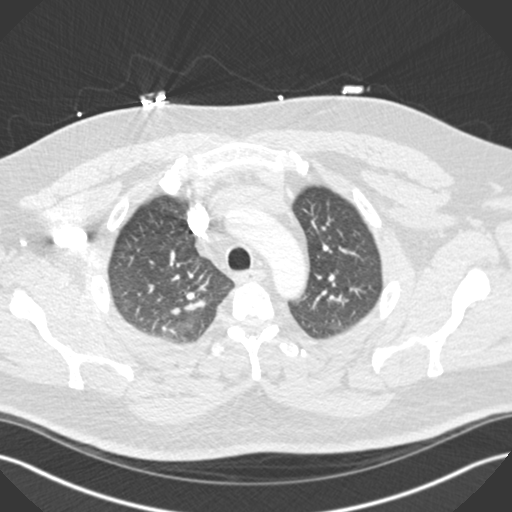
[im 255/303  mediastinal]
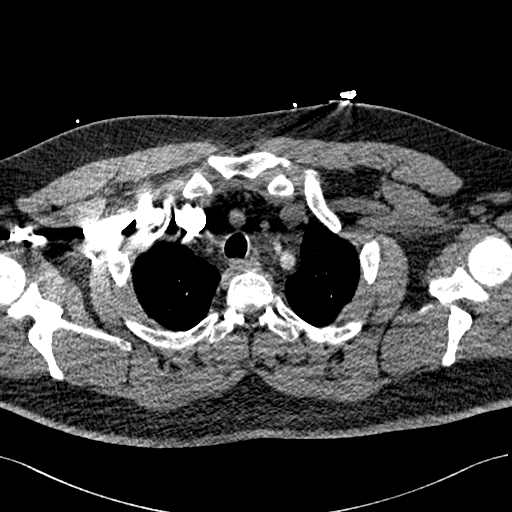
[im 271/303  lung]
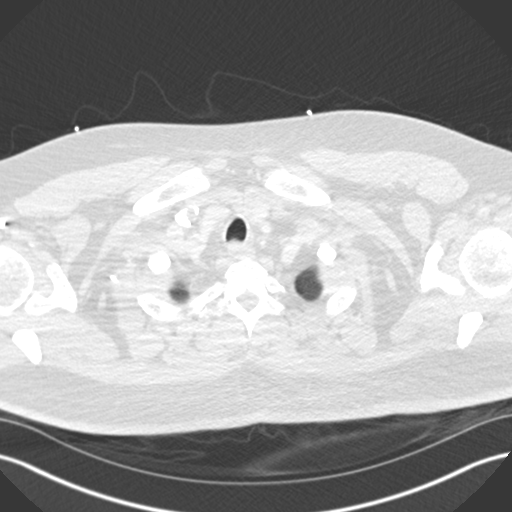
[im 287/303  mediastinal]
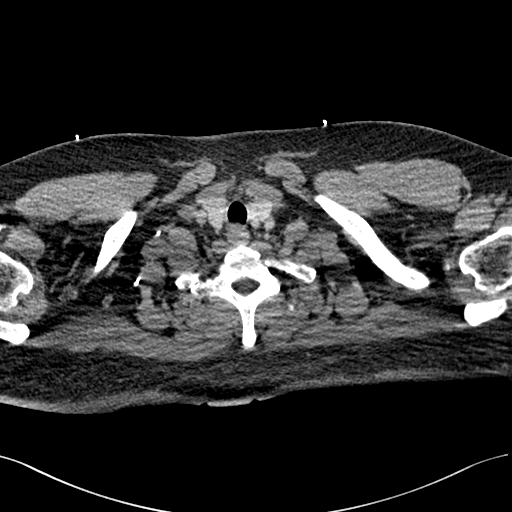

[19 of 36 positions shown; findings below may reference images not displayed]

FINDINGS: Cardiovascular: The quality of this exam for evaluation of pulmonary
embolism is good. The bolus is well timed. Mild limitations
secondary to patient size as detailed above. No pulmonary embolism
to the segmental level. Pulmonary artery enlargement, with 3.9 cm
outflow tract. Normal aortic caliber without dissection. Borderline
cardiomegaly, without pericardial effusion.

Mediastinum/Nodes: No mediastinal or hilar adenopathy. Low lung
volumes with right hemidiaphragm elevation.

Lungs/Pleura: No pleural fluid. Minimal degradation secondary to
patient body habitus and overlying EKG lead artifacts.

Upper Abdomen:Hepatic steatosis. Normal imaged portions of the
spleen, stomach, pancreas, adrenal glands, kidneys, gallbladder.

Musculoskeletal: No acute osseous abnormality.

Review of the MIP images confirms the above findings.
IMPRESSION: 1. Mildly degraded exam secondary to patient body habitus. No
pulmonary embolism to the segmental level.
2. Pulmonary artery enlargement suggests pulmonary arterial
hypertension.
3. Low lung volumes with right hemidiaphragm elevation.
4. Hepatic steatosis.

## 2016-12-13 IMAGING — CR DG CHEST 1V
2 series · 2 of 2 positions shown · non-contrast
Comparison: CT from earlier in the same day

CLINICAL DATA: Respiratory distress

EXAM:
CHEST 1 VIEW

[ap (1 of 2)]
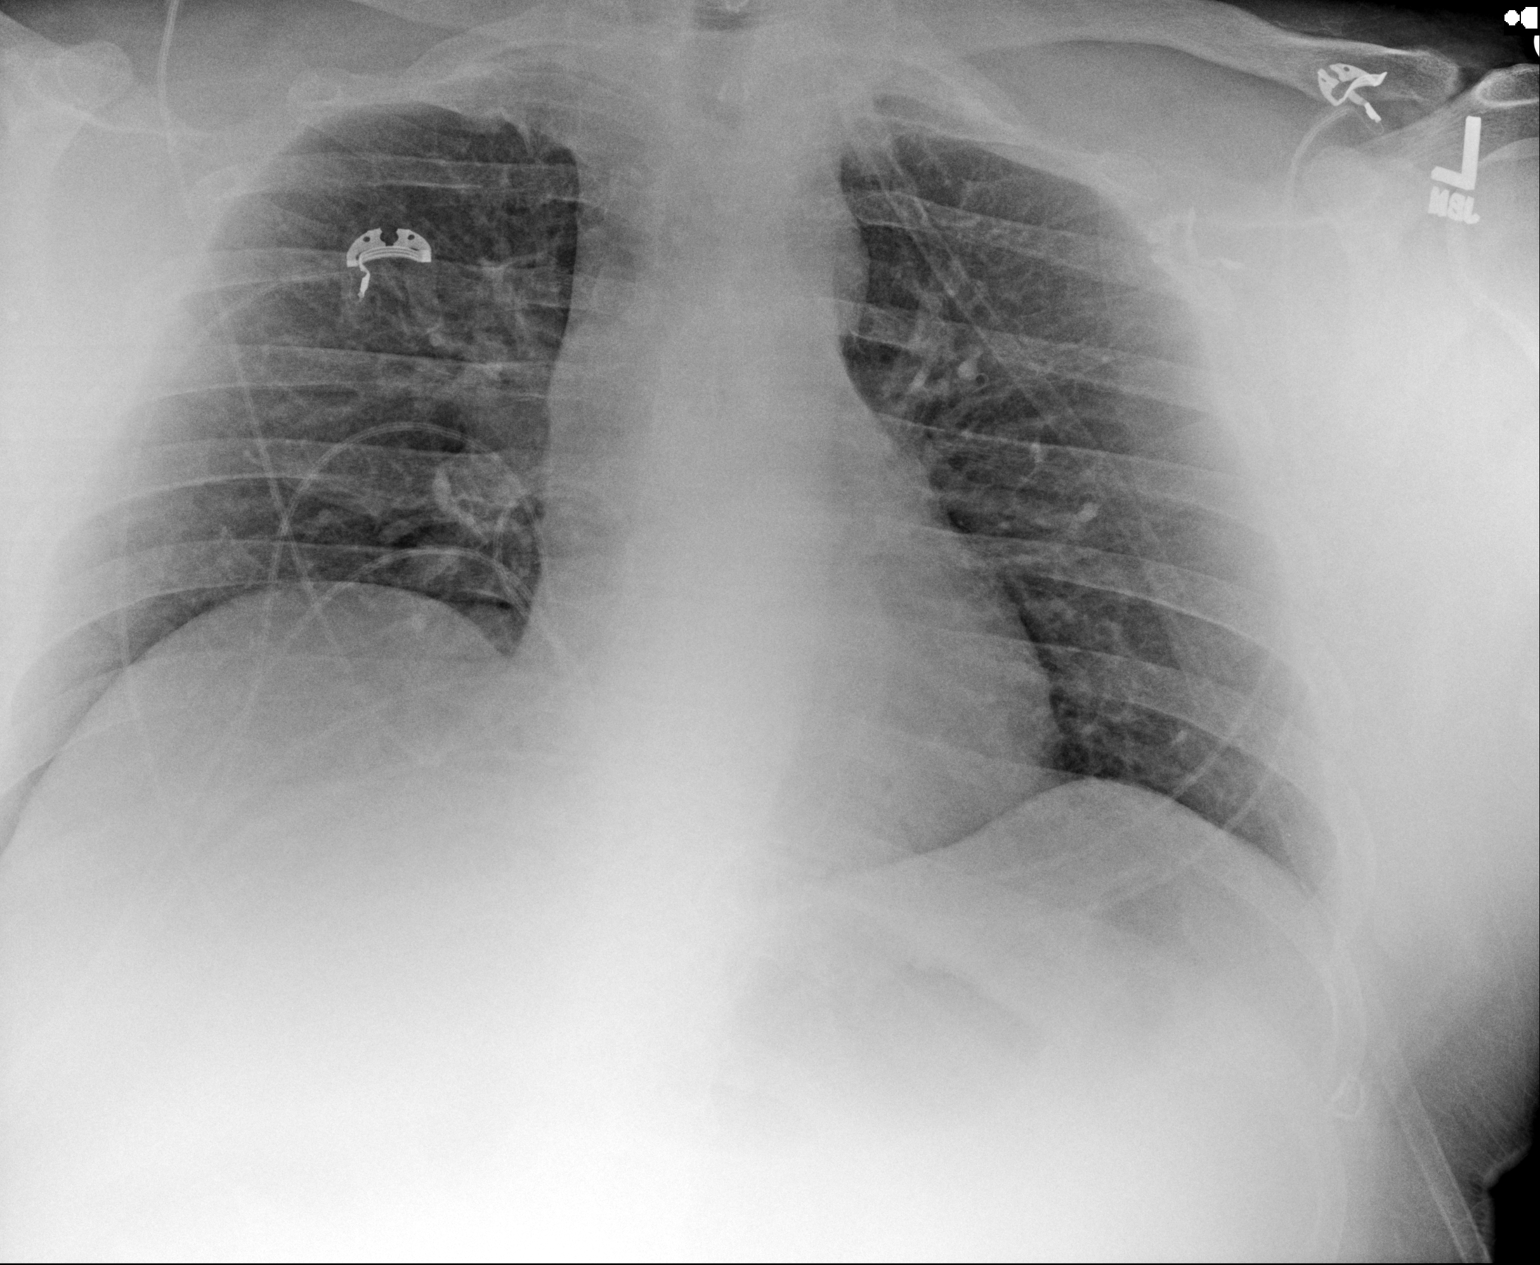

[ap (2 of 2)]
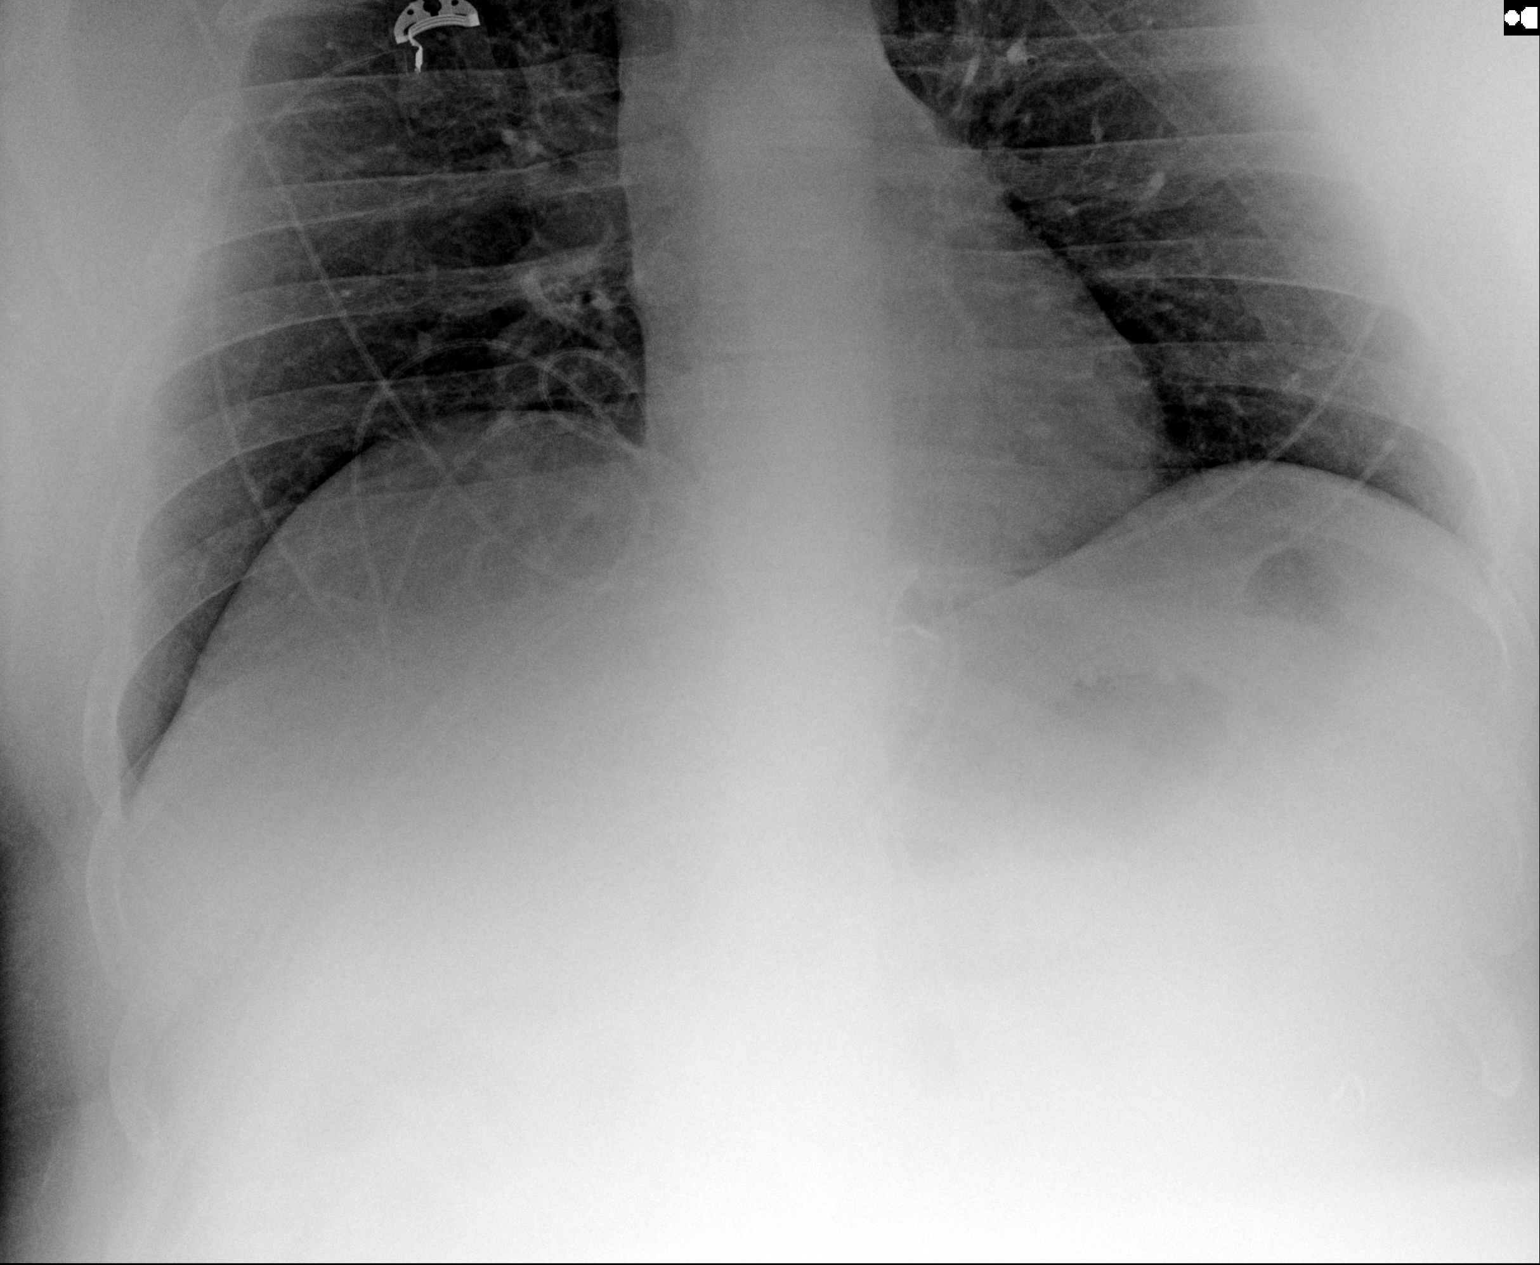

[2 of 2 positions shown; findings below may reference images not displayed]

FINDINGS: The heart size and mediastinal contours are within normal limits.
Both lungs are clear. The visualized skeletal structures are
unremarkable.
IMPRESSION: No active disease.

## 2016-12-13 MED ORDER — SODIUM CHLORIDE 0.9 % IV BOLUS (SEPSIS)
2000.0000 mL | Freq: Once | INTRAVENOUS | Status: AC
  Administered 2016-12-13: 2000 mL via INTRAVENOUS

## 2016-12-13 MED ORDER — IPRATROPIUM BROMIDE 0.02 % IN SOLN: 0.5000 mg | Freq: Once | RESPIRATORY_TRACT | Status: DC

## 2016-12-13 MED ORDER — ALBUTEROL SULFATE (2.5 MG/3ML) 0.083% IN NEBU: 5.0000 mg | INHALATION_SOLUTION | RESPIRATORY_TRACT | Status: DC | PRN

## 2016-12-13 MED ORDER — SODIUM CHLORIDE 0.9 % IV BOLUS (SEPSIS)
1000.0000 mL | Freq: Once | INTRAVENOUS | Status: AC
Start: 2016-12-13 — End: 2016-12-13
  Administered 2016-12-13: 1000 mL via INTRAVENOUS

## 2016-12-13 MED ORDER — IPRATROPIUM-ALBUTEROL 0.5-2.5 (3) MG/3ML IN SOLN
3.0000 mL | RESPIRATORY_TRACT | Status: DC
  Administered 2016-12-13 (×3): 3 mL via RESPIRATORY_TRACT
  Filled 2016-12-13 (×3): qty 3

## 2016-12-13 MED ORDER — ONDANSETRON HCL 4 MG/2ML IJ SOLN: 4.0000 mg | Freq: Four times a day (QID) | INTRAMUSCULAR | Status: DC | PRN

## 2016-12-13 MED ORDER — OSELTAMIVIR PHOSPHATE 75 MG PO CAPS
75.0000 mg | ORAL_CAPSULE | Freq: Once | ORAL | Status: AC
  Administered 2016-12-13: 75 mg via ORAL
  Filled 2016-12-13: qty 1

## 2016-12-13 MED ORDER — ACETAMINOPHEN 650 MG RE SUPP: 650.0000 mg | Freq: Four times a day (QID) | RECTAL | Status: DC | PRN

## 2016-12-13 MED ORDER — ALBUTEROL SULFATE (2.5 MG/3ML) 0.083% IN NEBU
5.0000 mg | INHALATION_SOLUTION | Freq: Once | RESPIRATORY_TRACT | Status: AC
  Administered 2016-12-13: 5 mg via RESPIRATORY_TRACT
  Filled 2016-12-13: qty 6

## 2016-12-13 MED ORDER — ALBUTEROL (5 MG/ML) CONTINUOUS INHALATION SOLN
10.0000 mg | INHALATION_SOLUTION | RESPIRATORY_TRACT | Status: AC
  Administered 2016-12-13: 10 mg via RESPIRATORY_TRACT
  Filled 2016-12-13: qty 20

## 2016-12-13 MED ORDER — METHYLPREDNISOLONE SODIUM SUCC 125 MG IJ SOLR
60.0000 mg | Freq: Two times a day (BID) | INTRAMUSCULAR | Status: DC
  Administered 2016-12-13 – 2016-12-15 (×4): 60 mg via INTRAVENOUS
  Filled 2016-12-13 (×4): qty 2

## 2016-12-13 MED ORDER — MAGNESIUM SULFATE 2 GM/50ML IV SOLN
2.0000 g | Freq: Once | INTRAVENOUS | Status: AC
Start: 2016-12-13 — End: 2016-12-13
  Administered 2016-12-13: 2 g via INTRAVENOUS
  Filled 2016-12-13: qty 50

## 2016-12-13 MED ORDER — POLYETHYLENE GLYCOL 3350 17 G PO PACK: 17.0000 g | PACK | Freq: Every day | ORAL | Status: DC | PRN

## 2016-12-13 MED ORDER — IPRATROPIUM BROMIDE 0.02 % IN SOLN
0.5000 mg | Freq: Once | RESPIRATORY_TRACT | Status: AC
  Administered 2016-12-13: 0.5 mg via RESPIRATORY_TRACT
  Filled 2016-12-13: qty 2.5

## 2016-12-13 MED ORDER — ENOXAPARIN SODIUM 40 MG/0.4ML ~~LOC~~ SOLN
40.0000 mg | SUBCUTANEOUS | Status: DC
  Administered 2016-12-13 – 2016-12-14 (×2): 40 mg via SUBCUTANEOUS
  Filled 2016-12-13 (×2): qty 0.4

## 2016-12-13 MED ORDER — IOPAMIDOL (ISOVUE-370) INJECTION 76%
100.0000 mL | Freq: Once | INTRAVENOUS | Status: AC | PRN
  Administered 2016-12-13: 100 mL via INTRAVENOUS

## 2016-12-13 MED ORDER — OSELTAMIVIR PHOSPHATE 75 MG PO CAPS
75.0000 mg | ORAL_CAPSULE | Freq: Two times a day (BID) | ORAL | Status: DC
  Administered 2016-12-13 – 2016-12-15 (×4): 75 mg via ORAL
  Filled 2016-12-13 (×5): qty 1

## 2016-12-13 MED ORDER — ONDANSETRON HCL 4 MG PO TABS: 4.0000 mg | ORAL_TABLET | Freq: Four times a day (QID) | ORAL | Status: DC | PRN

## 2016-12-13 MED ORDER — ACETAMINOPHEN 325 MG PO TABS
650.0000 mg | ORAL_TABLET | Freq: Four times a day (QID) | ORAL | Status: DC | PRN
Start: 2016-12-13 — End: 2016-12-15
  Administered 2016-12-13 – 2016-12-14 (×2): 650 mg via ORAL
  Filled 2016-12-13 (×2): qty 2

## 2016-12-13 MED ORDER — ALLOPURINOL 300 MG PO TABS
600.0000 mg | ORAL_TABLET | Freq: Every day | ORAL | Status: DC
  Administered 2016-12-14 – 2016-12-15 (×2): 600 mg via ORAL
  Filled 2016-12-13 (×2): qty 2

## 2016-12-13 MED ORDER — ALLOPURINOL 300 MG PO TABS
300.0000 mg | ORAL_TABLET | Freq: Every day | ORAL | Status: DC
  Filled 2016-12-13: qty 1

## 2016-12-13 MED ORDER — SODIUM CHLORIDE 0.9 % IV BOLUS (SEPSIS)
500.0000 mL | Freq: Once | INTRAVENOUS | Status: AC
  Administered 2016-12-13: 500 mL via INTRAVENOUS

## 2016-12-13 MED ORDER — CITALOPRAM HYDROBROMIDE 20 MG PO TABS
10.0000 mg | ORAL_TABLET | Freq: Every day | ORAL | Status: DC
  Filled 2016-12-13: qty 1

## 2016-12-13 MED ORDER — METHYLPREDNISOLONE SODIUM SUCC 125 MG IJ SOLR
125.0000 mg | Freq: Once | INTRAMUSCULAR | Status: AC
  Administered 2016-12-13: 125 mg via INTRAVENOUS
  Filled 2016-12-13: qty 2

## 2016-12-13 NOTE — ED Notes (Addendum)
Spoke with [facility] charge nurse on [address] and confirmed pt is able to go to assigned bed.

## 2016-12-13 NOTE — ED Notes (Signed)
Critical Lactic Acid results reported of 3.09 reported to [place], [place] and Madelaine BhatAdam, RCharity fundraiser

## 2016-12-13 NOTE — ED Triage Notes (Signed)
C/o flu sx since Sunday. Was dx with flu on Wednesday.  States he started wheezing today and coughing is keeping him up.

## 2016-12-13 NOTE — H&P (Signed)
History and Physical    Gene BirchwoodSamuel B Hansen ONG:[id number] DOB: 09/28/1976 DOA: 12/13/2016  PCP: Etta QuillBRYAN,DANIEL J, PA-C   Patient coming from: [facility]  Chief Complaint: SOB, wheezing  HPI: Gene Hansen is a 41 y.o. male with medical history significant of PUD/Gastric ulcer, gout, neuropathy, depression and anxiety attacks who developed body ache, runny nose shortness of breath cough and watery eyes on Sunday, January 22. He presented to [facility] Emergency [facility] on Wednesday January 24 complaining of worsening of the above-mentioned symptoms. At that time patient was clinically diagnosed with flu symptoms, however influenza PCR was not performed. He was discharged on Tamiflu, but had never filled the prescription. Today [facility] complaints of persistent, progressively worsening cough,  severe wheezing and SOB since last night. Symptoms kept him awake the whole night and eventually in the morning he return [facility] for reevaluation. He was also concerned about and low-grade fever.  Patient underwent chest CT in outside facility that was negative for pulmonary embolism and demonstrated pulmonary artery enlargement suggestive of pulmonary arterial hypertension, low lung volume is with a right hemidiaphragm elevation, hepatic steatosis. On presentation his vital signs revealed mild fever of 99.53F, blood work showed normal white blood cells count, elevated lactic acid of 3.09, mild hypo-kalemia of 3.4 UA was negative for UTI, ABG showed PO2 of 67.1 mmhg and the normal pH 7.40  [facility]  Review of Systems: As per HPI otherwise 10 point review of systems negative.   Ambulatory Status: independent   Past Medical History:  Diagnosis Date  . Bleeding ulcer   . Depression, major   . Gout   . Kidney stone   . Lung nodule   . Neuropathy (HCC)   .  Neuropathy, lower extremity    bilateral lower extremities and feet  . OCD (obsessive compulsive disorder)   . Panic attacks   . Testicular mass   . Vertigo     Past Surgical History:  Procedure Laterality Date  . KIDNEY STONE SURGERY    . lung tumor     removal  . testicular tumor     removal    Social History   Social History  . Marital status: Single    Spouse name: N/A  . Number of children: N/A  . Years of education: N/A   Occupational History  . Not on file.   Social History Main Topics  . Smoking status: Never Smoker  . Smokeless tobacco: Never Used  . Alcohol use No  . Drug use: No  . Sexual activity: Not on file   Other Topics Concern  . Not on file   Social History Narrative  . No narrative on file    No Known Allergies  No family history on file.  Prior to Admission medications   Medication Sig Start Date End Date Taking? Authorizing Provider  allopurinol (ZYLOPRIM) 300 MG tablet Take 300 mg by mouth daily.    Yes Historical Provider, MD  cetirizine (ZYRTEC) 10 MG tablet Take 10 mg by mouth daily.   Yes Historical Provider, MD  citalopram (CELEXA) 10 MG tablet Take 10 mg by mouth daily.   Yes Historical Provider, MD  naproxen (NAPROSYN) 500 MG tablet Take 1 tablet (500 mg total) by mouth 2 (two) times daily. 12/11/16  Yes April Palumbo, MD    Physical Exam: Vitals:  12/13/16 1452 12/13/16 1453 12/13/16 1557 12/13/16 1618  BP: 145/85  (!) 166/99   Pulse: 109 103 (!) 106   Resp:  23 20   Temp:  98.5 F (36.9 C) 98.4 F (36.9 C)   TempSrc:  Oral Oral   SpO2: 94% 94% 94% 94%  Weight:   112.5 kg (248 lb 0.3 oz)   Height:   6' (1.829 m)      General: Appears calm and comfortable Eyes: PERRLA, EOMI, normal lids, iris ENT:  grossly normal hearing, lips & tongue, mucous membranes moist and intact Neck: no lymphoadenopathy, masses or thyromegaly Cardiovascular: RRR, no m/r/g. No JVD, carotid bruits. No LE edema.  Respiratory: bilateral  wheezes, rhonchi. Normal respiratory effort. No accessory muscle use observed Abdomen: soft, non-tender, non-distended, no organomegaly or masses appreciated. BS present in all quadrants Skin: no rash, ulcers or induration seen on limited exam Musculoskeletal: grossly normal tone BUE/BLE, good ROM, no bony abnormality or joint deformities observed Psychiatric: grossly normal mood and affect, speech fluent and appropriate, alert and oriented x3 Neurologic: CN II-XII grossly intact, moves all extremities in coordinated fashion, sensation intact  Labs on Admission: I have personally reviewed following labs and imaging studies  CBC, BMP  GFR: Estimated Creatinine Clearance: 109.8 mL/min (by C-G formula based on SCr of 1.17 mg/dL).   Creatinine Clearance: Estimated Creatinine Clearance: 109.8 mL/min (by C-G formula based on SCr of 1.17 mg/dL).    Radiological Exams on Admission: Dg Chest 1 View  Result Date: 12/13/2016 CLINICAL DATA:  Respiratory distress EXAM: CHEST 1 VIEW COMPARISON:  CT from earlier in the same day FINDINGS: The heart size and mediastinal contours are within normal limits. Both lungs are clear. The visualized skeletal structures are unremarkable. IMPRESSION: No active disease. Electronically Signed   By: Alcide Clever M.D.   On: 12/13/2016 17:10   Ct Angio Chest Pe W Or Wo Contrast  Result Date: 12/13/2016 CLINICAL DATA:  Wheezing. Recently diagnosed with flu. Improved cough. Short of breath. Chest pain. Back pain. Hypoxia. EXAM: CT ANGIOGRAPHY CHEST WITH CONTRAST TECHNIQUE: Multidetector CT imaging of the chest was performed using the standard protocol during bolus administration of intravenous contrast. Multiplanar CT image reconstructions and MIPs were obtained to evaluate the vascular anatomy. CONTRAST:  100 cc of Isovue 370 COMPARISON:  Plain films of 11/24/2013.  No prior CT. FINDINGS: Cardiovascular: The quality of this exam for evaluation of pulmonary embolism is  good. The bolus is well timed. Mild limitations secondary to patient size as detailed above. No pulmonary embolism to the segmental level. Pulmonary artery enlargement, with 3.9 cm outflow tract. Normal aortic caliber without dissection. Borderline cardiomegaly, without pericardial effusion. Mediastinum/Nodes: No mediastinal or hilar adenopathy. Low lung volumes with right hemidiaphragm elevation. Lungs/Pleura: No pleural fluid. Minimal degradation secondary to patient body habitus and overlying EKG lead artifacts. Upper Abdomen:Hepatic steatosis. Normal imaged portions of the spleen, stomach, pancreas, adrenal glands, kidneys, gallbladder. Musculoskeletal: No acute osseous abnormality. Review of the MIP images confirms the above findings. IMPRESSION: 1. Mildly degraded exam secondary to patient body habitus. No pulmonary embolism to the segmental level. 2. Pulmonary artery enlargement suggests pulmonary arterial hypertension. 3. Low lung volumes with right hemidiaphragm elevation. 4. Hepatic steatosis. Electronically Signed   By: Jeronimo Greaves M.D.   On: 12/13/2016 12:13    EKG: Independently reviewed - sinus rhythm, NS ST-TW changes  Assessment/Plan Principal Problem:   Acute respiratory failure with hypoxia (HCC) Active Problems:   Gout   Depression with anxiety  Pulmonary hypertension   Acute hypoxic respiratory failure - most likely associated with a viral respiratory illness Influenza and respiratory viral panel ordered  Continue Tamiflu, Nebulizer t treatments, IV fluid, magnesium sulfate IV, supplemental oxygen  Repeat lactic acid  Gout Continue allopurinol and reminders for signs of exacerbation  Depression/anxiety Continue home doses of Celexa  Hepatic steatosis - incidental finding on CT scan Patient is asymptomatic, liver functions are normal  Might need to be worked up as outpatient  Pulmonary HTN Continue supplemental oxygen  Might benefit from echocardiogram when acute  respiratory illnesses over    DVT prophylaxis: Lovenox Code Status: full Family Communication: none Disposition Plan: MedSurg/telemetry Consults called: none Admission status: observation   Raymon Mutton, [place] Pager: [phone] [facility]  If 7PM-7AM, please contact night-coverage [email or web address] Password [facility]  12/13/2016, 5:15 PM   Attestation of Attending Supervision of Advanced Practitioner (PA/NP): Evaluation and management procedures were performed by the Advanced Practitioner under my supervision and collaboration.  I have seen and examined the patient and have reviewed the Advanced Practitioner's note and chart, and I agree with the management and plan.  41 yo clinically diagnosed with flu 2 days ago, worsening SOB, wheezing cough. Presented back to ED with hypoxia. Improved with nebs and O2 via [place]. CTA neg. BP (!) 166/99 (BP Location: Left Wrist)   Pulse (!) 106   Temp 98.4 F (36.9 C) (Oral)   Resp 20   Ht 6' (1.829 m)   Wt 112.5 kg (248 lb 0.3 oz)   SpO2 94%   BMI 33.64 kg/m  Wheezes throughout. 1. ACute Hypoxic Resp failure 2. Acute bronchitis 3. Lactic Acidosis Continue steroids, nebs, flu swab, tamiflu. Repeat lactic acid. IVF bolus   Candelaria Celeste, DO Attending Physician [facility]

## 2016-12-13 NOTE — ED Notes (Signed)
After consulting with EDP she feels that pt does not need 4030ml/kg of fluid bolus at this time because pt is not hypotensive, and is alert with no distress. Also no abx ordered at this time due to suspected viral cause.

## 2016-12-13 NOTE — ED Notes (Signed)
Entered pt room to find SpO2 80% on RA. Pt blue in lips. Pt responsive. No distress noted. RN and PA made aware.SpO2 returned to 98% with deep breathing. Pt placed on monitor.

## 2016-12-13 NOTE — Progress Notes (Addendum)
CRITICAL VALUE ALERT  Critical value received:  Lactic acid 5.4 Date of notification:  12/14/2015  Time of notification:  0703  Critical value read back:yes  Nurse who received alert: Fonnie MuLauren Broxton Broady  MD notified (1st page):  Burnadette PeterLynch  Time of first page:  0704  MD notified (2nd page):  Time of second page:   Responding MD: Burnadette PeterLynch  Time MD responded:  [phone]

## 2016-12-13 NOTE — ED Provider Notes (Signed)
[facility] Provider Note   CSN: [id number] Arrival date & time: 12/13/16  1022     History   Chief Complaint Chief Complaint  Patient presents with  . Cough    HPI Gene Hansen is a 41 y.o. male with a hx of Gastric ulcer, gout, neuropathy presents to the Emergency Department complaining of gradual, persistent, progressively worsening cough, wheezing onset asked night. Patient reports he's been sick with influenza for the last 5 days. Patient was seen 2 days ago and diagnosed with influenza. He reports that since that time his sore throat, myalgias and headache have improved. He reports he is drinking lots of water as directed. He denies a history of asthma or COPD. He states he does not smoke. He reports the cough and wheezing worsened last night and kept him awake. He reports associated chest and back pain with coughing only. He has taken Tylenol for this with moderate relief. He does not have an albuterol inhaler at home. Patient reports overall he is feeling better but is concerned about the persistent cough and low-grade fever. He is accompanied by his mother who reports that she thinks he is improving some but does not normally wheeze.  No known aggravating factors. Patient denies nausea, vomiting, weakness, dizziness, syncope, dysuria, hematuria. He reports one episode of loose stool this morning but no watery diarrhea.  Pt reports spending much of the week on the couch, but denies CP or leg swelling.    The history is provided by the patient and a parent. No language interpreter was used.    Past Medical History:  Diagnosis Date  . Bleeding ulcer   . Depression, major   . Gout   . Kidney stone   . Lung nodule   . Neuropathy (HCC)   . Neuropathy, lower extremity    bilateral lower extremities and feet  . OCD (obsessive compulsive disorder)   . Panic attacks   . Testicular mass   . Vertigo     Patient Active Problem List   Diagnosis Date Noted  . Hypoxia  12/13/2016    Past Surgical History:  Procedure Laterality Date  . KIDNEY STONE SURGERY    . lung tumor     removal  . testicular tumor     removal       Home Medications    Prior to Admission medications   Medication Sig Start Date End Date Taking? Authorizing Provider  allopurinol (ZYLOPRIM) 300 MG tablet Take 300 mg by mouth daily.    Yes Historical Provider, MD  cetirizine (ZYRTEC) 10 MG tablet Take 10 mg by mouth daily.   Yes Historical Provider, MD  citalopram (CELEXA) 10 MG tablet Take 10 mg by mouth daily.   Yes Historical Provider, MD  naproxen (NAPROSYN) 500 MG tablet Take 1 tablet (500 mg total) by mouth 2 (two) times daily. 12/11/16  Yes April Palumbo, MD    Family History No family history on file.  Social History Social History  Substance Use Topics  . Smoking status: Never Smoker  . Smokeless tobacco: Never Used  . Alcohol use No     Allergies   Patient has no known allergies.   Review of Systems Review of Systems  HENT: Positive for congestion, rhinorrhea and sinus pressure. Negative for sore throat.   Respiratory: Positive for cough and wheezing.   Gastrointestinal: Negative for abdominal pain, nausea and vomiting.  Genitourinary: Negative for dysuria.  Musculoskeletal: Negative for myalgias.  Skin: Negative for  rash.  Neurological: Negative for headaches.  All other systems reviewed and are negative.    Physical Exam Updated Vital Signs BP (!) 151/109 (BP Location: Left Arm)   Pulse 88   Temp 99.4 F (37.4 C) (Oral)   Resp 16   Ht 6' (1.829 m)   Wt 111.6 kg   SpO2 99%   BMI 33.36 kg/m   Physical Exam  Constitutional: He appears well-developed and well-nourished. No distress.  Awake, alert, nontoxic appearance  HENT:  Head: Normocephalic and atraumatic.  Nose: Mucosal edema and rhinorrhea present. No epistaxis. Right sinus exhibits no maxillary sinus tenderness and no frontal sinus tenderness. Left sinus exhibits no maxillary  sinus tenderness and no frontal sinus tenderness.  Mouth/Throat: Uvula is midline and mucous membranes are normal. Mucous membranes are not pale and not cyanotic. Posterior oropharyngeal erythema present. No oropharyngeal exudate, posterior oropharyngeal edema or tonsillar abscesses.  Mild erythema without edema or exudate  Eyes: Conjunctivae are normal. Pupils are equal, round, and reactive to light. No scleral icterus.  Neck: Normal range of motion and full passive range of motion without pain. Neck supple.  Cardiovascular: Normal rate, regular rhythm and intact distal pulses.   Pulmonary/Chest: Effort normal. No stridor. No respiratory distress. He has decreased breath sounds. He has wheezes.  Significantly diminished breath sounds throughout with fine expiratory wheezes Congested cough  Abdominal: Soft. Bowel sounds are normal. He exhibits no mass. There is no tenderness. There is no rebound and no guarding.  Musculoskeletal: Normal range of motion.  Neurological: He is alert.  Speech is clear and goal oriented Moves extremities without ataxia  Skin: Skin is warm and dry. No rash noted. He is not diaphoretic. There is pallor.  Psychiatric: He has a normal mood and affect.  Nursing note and vitals reviewed.    ED Treatments / Results  Labs (all labs ordered are listed, but only abnormal results are displayed) Labs Reviewed  CBC WITH DIFFERENTIAL/PLATELET - Abnormal; Notable for the following:       Result Value   Monocytes Absolute 1.2 (*)    All other components within normal limits  COMPREHENSIVE METABOLIC PANEL - Abnormal; Notable for the following:    Potassium 3.4 (*)    Glucose, Bld 111 (*)    Calcium 8.6 (*)    All other components within normal limits    EKG  EKG Interpretation  Date/Time:  Friday December 13 2016 11:27:53 EST Ventricular Rate:  83 PR Interval:    QRS Duration: 89 QT Interval:  366 QTC Calculation: 430 R Axis:   20 Text Interpretation:  Sinus  rhythm Baseline wander in lead(s) II III aVR aVL aVF V3 V4 Confirmed by HAVILAND MD, JULIE ([id number]) on 12/13/2016 11:48:24 AM       Radiology Ct Angio Chest Pe W Or Wo Contrast  Result Date: 12/13/2016 CLINICAL DATA:  Wheezing. Recently diagnosed with flu. Improved cough. Short of breath. Chest pain. Back pain. Hypoxia. EXAM: CT ANGIOGRAPHY CHEST WITH CONTRAST TECHNIQUE: Multidetector CT imaging of the chest was performed using the standard protocol during bolus administration of intravenous contrast. Multiplanar CT image reconstructions and MIPs were obtained to evaluate the vascular anatomy. CONTRAST:  100 cc of Isovue 370 COMPARISON:  Plain films of 11/24/2013.  No prior CT. FINDINGS: Cardiovascular: The quality of this exam for evaluation of pulmonary embolism is good. The bolus is well timed. Mild limitations secondary to patient size as detailed above. No pulmonary embolism to the segmental level. Pulmonary artery enlargement,  with 3.9 cm outflow tract. Normal aortic caliber without dissection. Borderline cardiomegaly, without pericardial effusion. Mediastinum/Nodes: No mediastinal or hilar adenopathy. Low lung volumes with right hemidiaphragm elevation. Lungs/Pleura: No pleural fluid. Minimal degradation secondary to patient body habitus and overlying EKG lead artifacts. Upper Abdomen:Hepatic steatosis. Normal imaged portions of the spleen, stomach, pancreas, adrenal glands, kidneys, gallbladder. Musculoskeletal: No acute osseous abnormality. Review of the MIP images confirms the above findings. IMPRESSION: 1. Mildly degraded exam secondary to patient body habitus. No pulmonary embolism to the segmental level. 2. Pulmonary artery enlargement suggests pulmonary arterial hypertension. 3. Low lung volumes with right hemidiaphragm elevation. 4. Hepatic steatosis. Electronically Signed   By: Jeronimo GreavesKyle  Talbot M.D.   On: 12/13/2016 12:13    Procedures Procedures (including critical care time)  Medications  Ordered in ED Medications  albuterol (PROVENTIL,VENTOLIN) solution continuous neb (10 mg Nebulization New Bag/Given 12/13/16 1244)  oseltamivir (TAMIFLU) capsule 75 mg (not administered)  albuterol (PROVENTIL) (2.5 MG/3ML) 0.083% nebulizer solution 5 mg (5 mg Nebulization Given 12/13/16 1059)  ipratropium (ATROVENT) nebulizer solution 0.5 mg (0.5 mg Nebulization Given 12/13/16 1059)  albuterol (PROVENTIL) (2.5 MG/3ML) 0.083% nebulizer solution 5 mg (5 mg Nebulization Given 12/13/16 1201)  methylPREDNISolone sodium succinate (SOLU-MEDROL) 125 mg/2 mL injection 125 mg (125 mg Intravenous Given 12/13/16 1205)  iopamidol (ISOVUE-370) 76 % injection 100 mL (100 mLs Intravenous Contrast Given 12/13/16 1146)     Initial Impression / Assessment and Plan / ED Course  I have reviewed the triage vital signs and the nursing notes.  Pertinent labs & imaging results that were available during my care of the patient were reviewed by me and considered in my medical decision making (see chart for details).  Clinical Course as of Dec 13 1317  Fri Dec 13, 2016  1126 Respiratory assessed patient after nebulizer and found him to be hypoxic to 80% with cyanosis around his lips. He is alert and oriented. He reports he is breathing some better.  Will repeat nebulizer if he has persistent wheezes and rhonchi. CT chest to assess for PE.  [HM]  1315 Discussed with Dr. Adrian BlackwaterStinson who will admit to obs med surg with continuous pulse ox  [HM]    Clinical Course User Index [HM] Dierdre ForthHannah Cylas Falzone, PA-C    Patient presents with very diminished breath sounds and wheezing. He was given initial nebulizer and then found to be hypoxic.  Breath sounds have continued to improve in terms of tidal volume but wheezing persists. Patient has been generally immobile for the last 5 days. CT angiogram chest is without PE or evidence of pneumonia. Suspect sequela of influenza. Patient was prescribed Tamiflu at discharge on Wednesday but after  risk and benefit discussion with prescribing physician patient elected not to fill or take the medication.  Patient will be admitted for further nebulizers and treatment. His oxygen saturations have remained above 90% as long as he is on a nebulizer or supplement oxygen via nasal cannula.  The patient was discussed with and seen by Dr. Particia NearingHaviland who agrees with the treatment plan.    Final Clinical Impressions(s) / ED Diagnoses   Final diagnoses:  Influenza-like illness  Hypoxia  Cough  Wheezing    New Prescriptions New Prescriptions   No medications on file     Dierdre ForthHannah Nikhita Mentzel, PA-C 12/13/16 1319    Jacalyn LefevreJulie Haviland, MD 12/13/16 1401

## 2016-12-13 NOTE — Progress Notes (Signed)
Per RT, pt. Placed on 4L of O2.

## 2016-12-14 DIAGNOSIS — J9601 Acute respiratory failure with hypoxia: Secondary | ICD-10-CM

## 2016-12-14 DIAGNOSIS — J101 Influenza due to other identified influenza virus with other respiratory manifestations: Secondary | ICD-10-CM | POA: Diagnosis present

## 2016-12-14 DIAGNOSIS — E872 Acidosis, unspecified: Secondary | ICD-10-CM | POA: Diagnosis present

## 2016-12-14 LAB — RESPIRATORY PANEL BY PCR
ADENOVIRUS-RVPPCR: NOT DETECTED
Bordetella pertussis: NOT DETECTED
CORONAVIRUS 229E-RVPPCR: NOT DETECTED
CORONAVIRUS HKU1-RVPPCR: NOT DETECTED
CORONAVIRUS NL63-RVPPCR: NOT DETECTED
CORONAVIRUS OC43-RVPPCR: NOT DETECTED
Chlamydophila pneumoniae: NOT DETECTED
Influenza A: NOT DETECTED
Influenza B: NOT DETECTED
METAPNEUMOVIRUS-RVPPCR: NOT DETECTED
Mycoplasma pneumoniae: NOT DETECTED
PARAINFLUENZA VIRUS 1-RVPPCR: NOT DETECTED
PARAINFLUENZA VIRUS 2-RVPPCR: NOT DETECTED
Parainfluenza Virus 3: NOT DETECTED
Parainfluenza Virus 4: NOT DETECTED
RESPIRATORY SYNCYTIAL VIRUS-RVPPCR: NOT DETECTED
Rhinovirus / Enterovirus: NOT DETECTED

## 2016-12-14 LAB — BASIC METABOLIC PANEL
Anion gap: 12 (ref 5–15)
BUN: 10 mg/dL (ref 6–20)
CALCIUM: 8.9 mg/dL (ref 8.9–10.3)
CO2: 23 mmol/L (ref 22–32)
CREATININE: 1.05 mg/dL (ref 0.61–1.24)
Chloride: 107 mmol/L (ref 101–111)
GFR calc Af Amer: >60 mL/min (ref >60)
GFR calc non Af Amer: >60 mL/min (ref >60)
GLUCOSE: 142 mg/dL — AB (ref 65–99)
POTASSIUM: 4.1 mmol/L (ref 3.5–5.1)
SODIUM: 142 mmol/L (ref 135–145)

## 2016-12-14 LAB — URINE CULTURE: Culture: NO GROWTH

## 2016-12-14 LAB — CBC
HCT: 44 % (ref 39.0–52.0)
Hemoglobin: 14.9 g/dL (ref 13.0–17.0)
MCH: 29.9 pg (ref 26.0–34.0)
MCHC: 33.9 g/dL (ref 30.0–36.0)
MCV: 88.4 fL (ref 78.0–100.0)
PLATELETS: 228 10*3/uL (ref 150–400)
RBC: 4.98 MIL/uL (ref 4.22–5.81)
RDW: 14 % (ref 11.5–15.5)
WBC: 6.8 10*3/uL (ref 4.0–10.5)

## 2016-12-14 MED ORDER — SODIUM CHLORIDE 0.9 % IV BOLUS (SEPSIS)
1000.0000 mL | Freq: Once | INTRAVENOUS | Status: AC
  Administered 2016-12-14: 1000 mL via INTRAVENOUS

## 2016-12-14 MED ORDER — HYDRALAZINE HCL 20 MG/ML IJ SOLN
10.0000 mg | INTRAMUSCULAR | Status: DC | PRN
  Administered 2016-12-14: 10 mg via INTRAVENOUS
  Filled 2016-12-14: qty 1

## 2016-12-14 MED ORDER — HYDRALAZINE HCL 25 MG PO TABS
25.0000 mg | ORAL_TABLET | Freq: Three times a day (TID) | ORAL | Status: DC
  Administered 2016-12-14: 25 mg via ORAL
  Filled 2016-12-14: qty 1

## 2016-12-14 MED ORDER — GUAIFENESIN ER 600 MG PO TB12
1200.0000 mg | ORAL_TABLET | Freq: Two times a day (BID) | ORAL | Status: DC
  Administered 2016-12-14 – 2016-12-15 (×3): 1200 mg via ORAL
  Filled 2016-12-14 (×3): qty 2

## 2016-12-14 MED ORDER — IPRATROPIUM-ALBUTEROL 0.5-2.5 (3) MG/3ML IN SOLN
3.0000 mL | Freq: Three times a day (TID) | RESPIRATORY_TRACT | Status: DC
  Administered 2016-12-14 – 2016-12-15 (×2): 3 mL via RESPIRATORY_TRACT
  Filled 2016-12-14 (×4): qty 3

## 2016-12-14 MED ORDER — HYDRALAZINE HCL 50 MG PO TABS
50.0000 mg | ORAL_TABLET | Freq: Three times a day (TID) | ORAL | Status: DC
  Administered 2016-12-14 – 2016-12-15 (×2): 50 mg via ORAL
  Filled 2016-12-14 (×2): qty 1

## 2016-12-14 MED ORDER — SODIUM CHLORIDE 0.9 % IV SOLN
INTRAVENOUS | Status: DC
Start: 2016-12-14 — End: 2016-12-15
  Administered 2016-12-14 (×2): via INTRAVENOUS

## 2016-12-14 NOTE — Progress Notes (Signed)
Pt ambulated in the hallway on room air with good toleration.  Sats remained 94-95% on room air while ambulating.  Left O2 off back in room.  Will continue to monitor sats on the continuous pulse ox.

## 2016-12-14 NOTE — Progress Notes (Signed)
[facility] Progress Note  Patient: Gene Hansen ZOX:[id number]   PCP: Etta QuillBRYAN,DANIEL J, PA-C DOB: 08/17/1976   DOA: 12/13/2016   DOS: 12/14/2016   Date of Service: the patient was seen and examined on 12/14/2016  Brief hospital course: Pt. with PMH of Neuropathy, gout, anxiety; admitted on 12/13/2016, with complaint of flulike symptoms, was found to have influenza bronchitis with bronchospasm and acute hypoxic respiratory failure. Currently further plan is continue current management and wean oxygen.  Assessment and Plan: 1. Acute respiratory failure with hypoxia (HCC) Influenza A infection with bronchitis and bronchospasm. Wheezing is gradually improving. We'll try to wean the oxygen to room air. Continue DuoNeb's continue steroids and continue Tamiflu. Add Mucinex, plenty device. Incentive spirometry.  2. Lactic acidosis. Likely due to high work of breathing in patients with influenza. We'll gently hydrate the patient.  3. Acute kidney injury. Creatinine on admission 1.46. Currently getting better 1.05. Continue IV hydration.  4. Accelerated hypertension. The patient on scheduled hydralazine. We'll monitor.  Bowel regimen: last BM 12/14/2015 Diet: Regular diet DVT Prophylaxis: subcutaneous Heparin  Advance goals of care discussion: full code  Family Communication: family was present at bedside, at the time of interview. The pt provided permission to discuss medical plan with the family. Opportunity was given to ask question and all questions were answered satisfactorily.   Disposition:  Discharge to home. Expected discharge date: 12/15/2016, may need oxygen  Consultants: none Procedures: none  Antibiotics: Anti-infectives    Start     Dose/Rate Route Frequency Ordered Stop   12/13/16 2200  oseltamivir (TAMIFLU) capsule 75 mg     75 mg Oral 2 times daily 12/13/16 1650 12/18/16 2159   12/13/16 1315  oseltamivir (TAMIFLU) capsule 75 mg     75 mg Oral  Once  12/13/16 1312 12/13/16 1327        Subjective: Feeling better, continues to have occasional shortness of breath. Continues to have these. Chest pain.  Objective: Physical Exam: Vitals:   12/13/16 2333 12/14/16 0504 12/14/16 1212 12/14/16 1320  BP:  (!) 153/96  (!) 171/108  Pulse:  79  84  Resp:  17  17  Temp:  98.4 F (36.9 C)  98.2 F (36.8 C)  TempSrc:  Oral  Oral  SpO2: 93% 95% 96% 95%  Weight:      Height:        Intake/Output Summary (Last 24 hours) at 12/14/16 1420 Last data filed at 12/14/16 1322  Gross per 24 hour  Intake             1940 ml  Output             4375 ml  Net            -2435 ml   Filed Weights   12/13/16 1029 12/13/16 1557  Weight: 111.6 kg (246 lb) 112.5 kg (248 lb 0.3 oz)    General: Alert, Awake and Oriented to Time, Place and Person. Appear in mild distress, affect appropriate Eyes: PERRL, Conjunctiva normal ENT: Oral Mucosa clear moist Neck: no JVD, no Abnormal Mass Or lumps Cardiovascular: S1 and S2 Present, no Murmur, Respiratory: Bilateral Air entry equal and Decreased, no use of accessory muscle, no Crackles, Occasional wheezes Abdomen: Bowel Sound present, Soft and no tenderness Skin: no redness, no Rash, no induration Extremities: no Pedal edema, no calf tenderness Neurologic: Grossly no focal neuro deficit. Bilaterally Equal motor strength  Data Reviewed: CBC:  Recent Labs Lab 12/13/16 1138 12/13/16 1820  12/14/16 0653  WBC 6.6 3.6* 6.8  NEUTROABS 2.4  --   --   HGB 15.6 15.5 14.9  HCT 45.9 45.4 44.0  MCV 88.4 89.0 88.4  PLT 188 206 228   Basic Metabolic Panel:  Recent Labs Lab 12/13/16 1138 12/13/16 1820 12/14/16 0653  NA 137  --  142  K 3.4*  --  4.1  CL 104  --  107  CO2 24  --  23  GLUCOSE 111*  --  142*  BUN 13  --  10  CREATININE 1.17 1.46* 1.05  CALCIUM 8.6*  --  8.9  MG  --  2.2  --     Liver Function Tests:  Recent Labs Lab 12/13/16 1138  AST 29  ALT 29  ALKPHOS 67  BILITOT 0.5  PROT  7.1  ALBUMIN 4.1   No results for input(s): LIPASE, AMYLASE in the last 168 hours. No results for input(s): AMMONIA in the last 168 hours. Coagulation Profile: No results for input(s): INR, PROTIME in the last 168 hours. Cardiac Enzymes: No results for input(s): CKTOTAL, CKMB, CKMBINDEX, TROPONINI in the last 168 hours. BNP (last 3 results) No results for input(s): PROBNP in the last 8760 hours.  CBG: No results for input(s): GLUCAP in the last 168 hours.  Studies: Dg Chest 1 View  Result Date: 12/13/2016 CLINICAL DATA:  Respiratory distress EXAM: CHEST 1 VIEW COMPARISON:  CT from earlier in the same day FINDINGS: The heart size and mediastinal contours are within normal limits. Both lungs are clear. The visualized skeletal structures are unremarkable. IMPRESSION: No active disease. Electronically Signed   By: Alcide Clever M.D.   On: 12/13/2016 17:10     Scheduled Meds: . allopurinol  600 mg Oral Daily  . enoxaparin (LOVENOX) injection  40 mg Subcutaneous Q24H  . guaiFENesin  1,200 mg Oral BID  . hydrALAZINE  25 mg Oral Q8H  . ipratropium-albuterol  3 mL Nebulization TID  . methylPREDNISolone (SOLU-MEDROL) injection  60 mg Intravenous Q12H  . oseltamivir  75 mg Oral BID   Continuous Infusions: . sodium chloride 150 mL/hr at 12/14/16 1210   PRN Meds: acetaminophen **OR** acetaminophen, ondansetron **OR** ondansetron (ZOFRAN) IV, polyethylene glycol  Time spent: 30 minutes  Author: Lynden Oxford, MD [facility] Pager: [phone] 12/14/2016 2:20 PM  If 7PM-7AM, please contact night-coverage at [email or web address], password [facility]

## 2016-12-15 LAB — BASIC METABOLIC PANEL
ANION GAP: 9 (ref 5–15)
BUN: 17 mg/dL (ref 6–20)
CALCIUM: 8.6 mg/dL — AB (ref 8.9–10.3)
CHLORIDE: 110 mmol/L (ref 101–111)
CO2: 21 mmol/L — AB (ref 22–32)
CREATININE: 1.19 mg/dL (ref 0.61–1.24)
GFR calc Af Amer: >60 mL/min (ref >60)
GFR calc non Af Amer: >60 mL/min (ref >60)
GLUCOSE: 131 mg/dL — AB (ref 65–99)
Potassium: 3.7 mmol/L (ref 3.5–5.1)
Sodium: 140 mmol/L (ref 135–145)

## 2016-12-15 LAB — CBC
HCT: 45.1 % (ref 39.0–52.0)
HEMOGLOBIN: 15.1 g/dL (ref 13.0–17.0)
MCH: 29.7 pg (ref 26.0–34.0)
MCHC: 33.5 g/dL (ref 30.0–36.0)
MCV: 88.6 fL (ref 78.0–100.0)
Platelets: 267 10*3/uL (ref 150–400)
RBC: 5.09 MIL/uL (ref 4.22–5.81)
RDW: 14 % (ref 11.5–15.5)
WBC: 16.9 10*3/uL — ABNORMAL HIGH (ref 4.0–10.5)

## 2016-12-15 LAB — PROCALCITONIN: Procalcitonin: 0.1 ng/mL

## 2016-12-15 MED ORDER — OSELTAMIVIR PHOSPHATE 75 MG PO CAPS: 75.0000 mg | ORAL_CAPSULE | Freq: Two times a day (BID) | ORAL | 0 refills | Status: DC

## 2016-12-15 MED ORDER — PREDNISONE 10 MG PO TABS: ORAL_TABLET | ORAL | 0 refills | Status: DC

## 2016-12-15 MED ORDER — ALBUTEROL SULFATE HFA 108 (90 BASE) MCG/ACT IN AERS: 2.0000 | INHALATION_SPRAY | Freq: Four times a day (QID) | RESPIRATORY_TRACT | 0 refills | Status: AC | PRN

## 2016-12-15 MED ORDER — GUAIFENESIN ER 600 MG PO TB12: 1200.0000 mg | ORAL_TABLET | Freq: Two times a day (BID) | ORAL | 0 refills | Status: AC

## 2016-12-15 MED ORDER — OSELTAMIVIR PHOSPHATE 75 MG PO CAPS: 75.0000 mg | ORAL_CAPSULE | Freq: Two times a day (BID) | ORAL | 0 refills | Status: AC

## 2016-12-15 MED ORDER — IPRATROPIUM-ALBUTEROL 0.5-2.5 (3) MG/3ML IN SOLN: 3.0000 mL | RESPIRATORY_TRACT | Status: DC | PRN

## 2016-12-15 MED ORDER — HYDROCHLOROTHIAZIDE 25 MG PO TABS: 25.0000 mg | ORAL_TABLET | Freq: Every day | ORAL | 0 refills | Status: AC

## 2016-12-15 NOTE — Discharge Summary (Signed)
[facility] Discharge Summary   Patient: Gene Hansen ZOX:[id number]   PCP: Etta Quill, PA-C DOB: [birth year]   Date of admission: 12/13/2016   Date of discharge: 12/15/2016     Discharge Diagnoses:  Principal Problem:   Acute respiratory failure with hypoxia (HCC) Active Problems:   Gout   Depression with anxiety   Pulmonary hypertension   Lactic acidosis   Influenza A with respiratory manifestations   Admitted From: home Disposition:  home  Recommendations for Outpatient Follow-up:  1. Please follow up with PCP in 1 wek   Follow-up Information    BRYAN,DANIEL J, PA-C. Schedule an appointment as soon as possible for a visit in 1 week(s).   Specialty:  Family Medicine Why:  adjust BP medication.  Contact information: [address] [place] [address] [phone]          Diet recommendation: cardiac diet  Activity: The patient is advised to gradually reintroduce usual activities.  Discharge Condition: good  Code Status: full code  History of present illness: As per the H and P dictated on admission, "Gene Hansen is a 41 y.o. male with medical history significant of PUD/Gastric ulcer, gout, neuropathy, depression and anxiety attacks who developed body ache, runny nose shortness of breath cough and watery eyes on Sunday, January 22. He presented to the Emergency Department [facility] on Wednesday January 24 complaining of worsening of the above-mentioned symptoms. At that time patient was clinically diagnosed with flu symptoms, however influenza PCR was not performed. He was discharged on Tamiflu, but had never filled the prescription. Today he returned to [facility] complaints of persistent, progressively worsening cough,  severe wheezing and SOBsince last night. Symptoms kept him awake the whole night and eventually in the morning he return to the [facility] for reevaluation. He was also  concerned about and low-grade fever."  Hospital Course:   Summary of his active problems in the hospital is as following. 1. Acute respiratory failure with hypoxia (HCC) Influenza A infection with bronchitis and bronchospasm. Wheezing is improving. On room air. Continue steroids and continue Tamiflu. Add Mucinex, plenty device. Incentive spirometry.  2. Lactic acidosis. Stable   3. Acute kidney injury. Resolved.  4. Accelerated hypertension. Start on HCTZ.  All other chronic medical condition were stable during the hospitalization.  Patient was ambulatory without any assistance. On the day of the discharge the patient's vitals were stable, and no other acute medical condition were reported by patient. the patient was felt safe to be discharge at home with family.  Procedures and Results:  none   Consultations:  none  DISCHARGE MEDICATION: Discharge Medication List as of 12/15/2016 12:06 PM    START taking these medications   Details  albuterol (PROVENTIL HFA;VENTOLIN HFA) 108 (90 Base) MCG/ACT inhaler Inhale 2 puffs into the lungs every 6 (six) hours as needed for wheezing or shortness of breath., Starting Sun 12/15/2016, Normal    guaiFENesin (MUCINEX) 600 MG 12 hr tablet Take 2 tablets (1,200 mg total) by mouth 2 (two) times daily., Starting Sun 12/15/2016, Normal    hydrochlorothiazide (HYDRODIURIL) 25 MG tablet Take 1 tablet (25 mg total) by mouth daily. Start after 1 week., Starting Mon 12/23/2016, Normal      CONTINUE these medications which have CHANGED   Details  oseltamivir (TAMIFLU) 75 MG capsule Take 1 capsule (75 mg total) by mouth 2 (two) times daily., Starting Mon 12/16/2016, Until Wed 12/18/2016, Normal  predniSONE (DELTASONE) 10 MG tablet Take 40mg  daily for 3days,Take 30mg  daily for 3days,Take 20mg  daily for 3days,Take 10mg  daily for 3days, then stop., Normal      CONTINUE these medications which have NOT CHANGED   Details  acetaminophen (TYLENOL)  500 MG tablet Take 1,000 mg by mouth every 6 (six) hours as needed for headache (pain)., Historical Med    allopurinol (ZYLOPRIM) 300 MG tablet Take 600 mg by mouth daily. , Historical Med    cetirizine (ZYRTEC) 10 MG tablet Take 10 mg by mouth 2 (two) times daily as needed for allergies. , Historical Med    Cholecalciferol (VITAMIN D-3) 5000 units TABS Take 5,000 Units by mouth daily., Historical Med    Cyanocobalamin 1500 MCG TBDP Take 1,500 mcg by mouth daily. Vitamin B12, Historical Med    cyanocobalamin (,VITAMIN B-12,) 1000 MCG/ML injection Inject 1,000 mcg into the muscle every 14 (fourteen) days. Last injection 3-6 months ago, Historical Med    naproxen (NAPROSYN) 500 MG tablet Take 1 tablet (500 mg total) by mouth 2 (two) times daily., Starting Wed 12/11/2016, Print      STOP taking these medications     indomethacin (INDOCIN) 25 MG capsule        Allergies  Allergen Reactions  . Advil [Ibuprofen] Other (See Comments)    Ulcer diagnosis  . Aspirin Other (See Comments)    Ulcer diagnosis   Discharge Instructions    Diet - low sodium heart healthy    Complete by:  As directed    Discharge instructions    Complete by:  As directed    It is important that you read following instructions as well as go over your medication list with RN to help you understand your care after this hospitalization.  Discharge Instructions: Please follow-up with PCP in one week  Please request your primary care physician to go over all Hospital Tests and Procedure/Radiological results at the follow up,  Please get all Hospital records sent to your PCP by signing hospital release before you go home.   Do not take more than prescribed Pain, Sleep and Anxiety Medications. You were cared for by a hospitalist during your hospital stay. If you have any questions about your discharge medications or the care you received while you were in the hospital after you are discharged, you can call the unit  and ask to speak with the hospitalist on call if the hospitalist that took care of you is not available.  Once you are discharged, your primary care physician will handle any further medical issues. Please note that NO REFILLS for any discharge medications will be authorized once you are discharged, as it is imperative that you return to your primary care physician (or establish a relationship with a primary care physician if you do not have one) for your aftercare needs so that they can reassess your need for medications and monitor your lab values. You Must read complete instructions/literature along with all the possible adverse reactions/side effects for all the Medicines you take and that have been prescribed to you. Take any new Medicines after you have completely understood and accept all the possible adverse reactions/side effects. Wear Seat belts while driving. If you have smoked or chewed Tobacco in the last 2 yrs please stop smoking and/or stop any Recreational drug use.   Increase activity slowly    Complete by:  As directed      Discharge Exam: Filed Weights   12/13/16 1029 12/13/16 1557 12/15/16  1610  Weight: 111.6 kg (246 lb) 112.5 kg (248 lb 0.3 oz) 120 kg (264 lb 8.8 oz)   Vitals:   12/14/16 2105 12/15/16 0442  BP: (!) 163/84 (!) 165/90  Pulse: 96 71  Resp: 18 18  Temp: 98.1 F (36.7 C) 97.8 F (36.6 C)   General: Appear in no distress, no Rash; Oral Mucosa moist. Cardiovascular: S1 and S2 Present, no Murmur, no JVD Respiratory: Bilateral Air entry present and Clear to Auscultation, no Crackles, no wheezes Abdomen: Bowel Sound present, Soft and no tenderness Extremities: no Pedal edema, no calf tenderness Neurology: Grossly no focal neuro deficit.  The results of significant diagnostics from this hospitalization (including imaging, microbiology, ancillary and laboratory) are listed below for reference.    Significant Diagnostic Studies: Dg Chest 1 View  Result  Date: 12/13/2016 CLINICAL DATA:  Respiratory distress EXAM: CHEST 1 VIEW COMPARISON:  CT from earlier in the same day FINDINGS: The heart size and mediastinal contours are within normal limits. Both lungs are clear. The visualized skeletal structures are unremarkable. IMPRESSION: No active disease. Electronically Signed   By: Alcide Clever M.D.   On: 12/13/2016 17:10   Ct Angio Chest Pe W Or Wo Contrast  Result Date: 12/13/2016 CLINICAL DATA:  Wheezing. Recently diagnosed with flu. Improved cough. Short of breath. Chest pain. Back pain. Hypoxia. EXAM: CT ANGIOGRAPHY CHEST WITH CONTRAST TECHNIQUE: Multidetector CT imaging of the chest was performed using the standard protocol during bolus administration of intravenous contrast. Multiplanar CT image reconstructions and MIPs were obtained to evaluate the vascular anatomy. CONTRAST:  100 cc of Isovue 370 COMPARISON:  Plain films of 11/24/2013.  No prior CT. FINDINGS: Cardiovascular: The quality of this exam for evaluation of pulmonary embolism is good. The bolus is well timed. Mild limitations secondary to patient size as detailed above. No pulmonary embolism to the segmental level. Pulmonary artery enlargement, with 3.9 cm outflow tract. Normal aortic caliber without dissection. Borderline cardiomegaly, without pericardial effusion. Mediastinum/Nodes: No mediastinal or hilar adenopathy. Low lung volumes with right hemidiaphragm elevation. Lungs/Pleura: No pleural fluid. Minimal degradation secondary to patient body habitus and overlying EKG lead artifacts. Upper Abdomen:Hepatic steatosis. Normal imaged portions of the spleen, stomach, pancreas, adrenal glands, kidneys, gallbladder. Musculoskeletal: No acute osseous abnormality. Review of the MIP images confirms the above findings. IMPRESSION: 1. Mildly degraded exam secondary to patient body habitus. No pulmonary embolism to the segmental level. 2. Pulmonary artery enlargement suggests pulmonary arterial  hypertension. 3. Low lung volumes with right hemidiaphragm elevation. 4. Hepatic steatosis. Electronically Signed   By: Jeronimo Greaves M.D.   On: 12/13/2016 12:13    Microbiology: Recent Results (from the past 240 hour(s))  Urine culture     Status: None   Collection Time: 12/13/16 12:50 PM  Result Value Ref Range Status   Specimen Description URINE, RANDOM  Final   Special Requests NONE  Final   Culture   Final    NO GROWTH Performed at [facility], [address] [place], [place] [address]    Report Status 12/14/2016 FINAL  Final  Blood Culture (routine x 2)     Status: None (Preliminary result)   Collection Time: 12/13/16  2:35 PM  Result Value Ref Range Status   Specimen Description BLOOD RIGHT ARM  Final   Special Requests BOTTLES DRAWN AEROBIC AND ANAEROBIC 5CC EACH  Final   Culture   Final    NO GROWTH 2 DAYS Performed at [facility], [address], [place], [place] [address]    Report Status PENDING  Incomplete  Blood Culture (routine x 2)     Status: None (Preliminary result)   Collection Time: 12/13/16  2:45 PM  Result Value Ref Range Status   Specimen Description BLOOD LEFT ARM  Final   Special Requests BOTTLES DRAWN AEROBIC AND ANAEROBIC 5CC EACH  Final   Culture   Final    NO GROWTH 2 DAYS Performed at [facility], [address], [place], [place] [address]    Report Status PENDING  Incomplete  Respiratory Panel by PCR     Status: None   Collection Time: 12/13/16  4:19 PM  Result Value Ref Range Status   Adenovirus NOT DETECTED NOT DETECTED Final   Coronavirus 229E NOT DETECTED NOT DETECTED Final   Coronavirus HKU1 NOT DETECTED NOT DETECTED Final   Coronavirus NL63 NOT DETECTED NOT DETECTED Final   Coronavirus OC43 NOT DETECTED NOT DETECTED Final   Metapneumovirus NOT DETECTED NOT DETECTED Final   Rhinovirus / Enterovirus NOT DETECTED NOT DETECTED Final   Influenza A NOT DETECTED NOT DETECTED Final   Influenza B NOT DETECTED NOT DETECTED  Final   Parainfluenza Virus 1 NOT DETECTED NOT DETECTED Final   Parainfluenza Virus 2 NOT DETECTED NOT DETECTED Final   Parainfluenza Virus 3 NOT DETECTED NOT DETECTED Final   Parainfluenza Virus 4 NOT DETECTED NOT DETECTED Final   Respiratory Syncytial Virus NOT DETECTED NOT DETECTED Final   Bordetella pertussis NOT DETECTED NOT DETECTED Final   Chlamydophila pneumoniae NOT DETECTED NOT DETECTED Final   Mycoplasma pneumoniae NOT DETECTED NOT DETECTED Final   Labs: CBC:  Recent Labs Lab 12/13/16 1138 12/13/16 1820 12/14/16 0653 12/15/16 0540  WBC 6.6 3.6* 6.8 16.9*  NEUTROABS 2.4  --   --   --   HGB 15.6 15.5 14.9 15.1  HCT 45.9 45.4 44.0 45.1  MCV 88.4 89.0 88.4 88.6  PLT 188 206 228 267   Basic Metabolic Panel:  Recent Labs Lab 12/13/16 1138 12/13/16 1820 12/14/16 0653 12/15/16 0540  NA 137  --  142 140  K 3.4*  --  4.1 3.7  CL 104  --  107 110  CO2 24  --  23 21*  GLUCOSE 111*  --  142* 131*  BUN 13  --  10 17  CREATININE 1.17 1.46* 1.05 1.19  CALCIUM 8.6*  --  8.9 8.6*  MG  --  2.2  --   --    Liver Function Tests:  Recent Labs Lab 12/13/16 1138  AST 29  ALT 29  ALKPHOS 67  BILITOT 0.5  PROT 7.1  ALBUMIN 4.1   No results for input(s): LIPASE, AMYLASE in the last 168 hours. No results for input(s): AMMONIA in the last 168 hours. Cardiac Enzymes: No results for input(s): CKTOTAL, CKMB, CKMBINDEX, TROPONINI in the last 168 hours. BNP (last 3 results) No results for input(s): BNP in the last 8760 hours. CBG: No results for input(s): GLUCAP in the last 168 hours. Time spent: 30 minutes  Signed:  Lynden Oxford  [facility] 12/15/2016 , 2:28 PM

## 2016-12-15 NOTE — Progress Notes (Signed)
Pt ready for DC, Rxs and instructions reviewed and copy given of instructions.

## 2016-12-18 LAB — CULTURE, BLOOD (ROUTINE X 2)
CULTURE: NO GROWTH
Culture: NO GROWTH

## 2019-03-01 ENCOUNTER — Other Ambulatory Visit: Payer: Self-pay

## 2019-03-01 ENCOUNTER — Emergency Department (HOSPITAL_BASED_OUTPATIENT_CLINIC_OR_DEPARTMENT_OTHER)
Admission: EM | Admit: 2019-03-01 | Discharge: 2019-03-01 | Disposition: A | Discharge status: Home / Self Care | Payer: Medicaid Other | Attending: Emergency Medicine | Admitting: Emergency Medicine

## 2019-03-01 ENCOUNTER — Encounter (HOSPITAL_BASED_OUTPATIENT_CLINIC_OR_DEPARTMENT_OTHER): Payer: Self-pay | Admitting: Emergency Medicine

## 2019-03-01 DIAGNOSIS — M545 Low back pain: Secondary | ICD-10-CM | POA: Insufficient documentation

## 2019-03-01 DIAGNOSIS — M5410 Radiculopathy, site unspecified: Secondary | ICD-10-CM

## 2019-03-01 DIAGNOSIS — Z79899 Other long term (current) drug therapy: Secondary | ICD-10-CM | POA: Diagnosis not present

## 2019-03-01 MED ORDER — HYDROCODONE-ACETAMINOPHEN 5-325 MG PO TABS: 1.0000 | ORAL_TABLET | Freq: Four times a day (QID) | ORAL | 0 refills | Status: DC | PRN

## 2019-03-01 MED ORDER — PREDNISONE 10 MG PO TABS: 20.0000 mg | ORAL_TABLET | Freq: Two times a day (BID) | ORAL | 0 refills | Status: DC

## 2019-03-01 NOTE — ED Triage Notes (Signed)
Reports right sided back pain which radiates down right leg for 3 weeks with history of the same.  Denies injury, loss of bowel or bladder control.

## 2019-03-01 NOTE — ED Provider Notes (Signed)
[facility] EMERGENCY DEPARTMENT Provider Note   CSN: [id number] Arrival date & time: 03/01/19  1751    History   Chief Complaint Chief Complaint  Patient presents with  . Back Pain    HPI Gene Hansen is a 43 y.o. male.     Patient is a 43 year old male with history of spinal stenosis and degenerative disc disease.  He presents today with complaints of low back pain.  This is worsened over the past week.  He describes pain that radiates into his right posterior thigh.  He denies any weakness or numbness.  He denies any bowel or bladder complaints.  The history is provided by the patient.  Back Pain  Location:  Lumbar spine Quality:  Shooting Radiates to:  Does not radiate and R thigh Pain severity:  Moderate Pain is:  Same all the time Onset quality:  Gradual Duration:  1 week Timing:  Constant Progression:  Worsening Chronicity:  Recurrent Relieved by:  Nothing Worsened by:  Palpation Ineffective treatments:  None tried   Past Medical History:  Diagnosis Date  . Bleeding ulcer   . Depression, major   . Gout   . Kidney stone   . Lung nodule   . Neuropathy   . Neuropathy, lower extremity    bilateral lower extremities and feet  . OCD (obsessive compulsive disorder)   . Panic attacks   . Testicular mass   . Vertigo     Patient Active Problem List   Diagnosis Date Noted  . Lactic acidosis 12/14/2016  . Influenza A with respiratory manifestations 12/14/2016  . Acute respiratory failure with hypoxia (HCC) 12/13/2016  . Gout 12/13/2016  . Depression with anxiety 12/13/2016  . Pulmonary hypertension (HCC) 12/13/2016    Past Surgical History:  Procedure Laterality Date  . KIDNEY STONE SURGERY    . lung tumor     removal  . testicular tumor     removal        Home Medications    Prior to Admission medications   Medication Sig Start Date End Date Taking? Authorizing Provider  acetaminophen (TYLENOL) 500 MG tablet Take 1,000 mg by  mouth every 6 (six) hours as needed for headache (pain).    [provider]  albuterol (PROVENTIL HFA;VENTOLIN HFA) 108 (90 Base) MCG/ACT inhaler Inhale 2 puffs into the lungs every 6 (six) hours as needed for wheezing or shortness of breath. 12/15/16   Rolly Salter, MD  allopurinol (ZYLOPRIM) 300 MG tablet Take 600 mg by mouth daily.     [provider]  cetirizine (ZYRTEC) 10 MG tablet Take 10 mg by mouth 2 (two) times daily as needed for allergies.     [provider]  Cholecalciferol (VITAMIN D-3) 5000 units TABS Take 5,000 Units by mouth daily.    [provider]  cyanocobalamin (,VITAMIN B-12,) 1000 MCG/ML injection Inject 1,000 mcg into the muscle every 14 (fourteen) days. Last injection 3-6 months ago    [provider]  Cyanocobalamin 1500 MCG TBDP Take 1,500 mcg by mouth daily. Vitamin B12    [provider]  guaiFENesin (MUCINEX) 600 MG 12 hr tablet Take 2 tablets (1,200 mg total) by mouth 2 (two) times daily. 12/15/16   Rolly Salter, MD  hydrochlorothiazide (HYDRODIURIL) 25 MG tablet Take 1 tablet (25 mg total) by mouth daily. Start after 1 week. 12/23/16   Rolly Salter, MD  HYDROcodone-acetaminophen (NORCO) 5-325 MG tablet Take 1-2 tablets by mouth every 6 (  six) hours as needed. 03/01/19   Geoffery Lyonselo, Rakiyah Esch, MD  naproxen (NAPROSYN) 500 MG tablet Take 1 tablet (500 mg total) by mouth 2 (two) times daily. Patient not taking: Reported on 12/13/2016 12/11/16   Palumbo, April, MD  predniSONE (DELTASONE) 10 MG tablet Take 2 tablets (20 mg total) by mouth 2 (two) times daily. 03/01/19   Geoffery Lyonselo, Ryeleigh Santore, MD    Family History History reviewed. No pertinent family history.  Social History Social History   Tobacco Use  . Smoking status: Never Smoker  . Smokeless tobacco: Never Used  Substance Use Topics  . Alcohol use: No  . Drug use: No     Allergies   Advil [ibuprofen] and Aspirin   Review of Systems Review of Systems   Musculoskeletal: Positive for back pain.  All other systems reviewed and are negative.    Physical Exam Updated Vital Signs BP (!) 168/123 (BP Location: Right Arm)   Pulse 74   Temp 98.2 F (36.8 C) (Oral)   Resp 16   Ht 6' (1.829 m)   Wt 106.1 kg   SpO2 98%   BMI 31.74 kg/m   Physical Exam Vitals signs and nursing note reviewed.  Constitutional:      Appearance: Normal appearance.  HENT:     Head: Normocephalic and atraumatic.  Pulmonary:     Effort: Pulmonary effort is normal.  Musculoskeletal: Normal range of motion.     Comments: There is tenderness to palpation in the soft tissues in the right lower lumbar region.  Skin:    General: Skin is warm and dry.  Neurological:     Mental Status: He is alert.     Comments: Homero FellersFrank is 5 out of 5 in both lower extremities.  DTRs are 1+ and symmetrical in the patellar and Achilles tendons bilaterally.  Strength is 5 out of 5 in both lower extremities.  He is able to ambulate, but with an antalgic gait.      ED Treatments / Results  Labs (all labs ordered are listed, but only abnormal results are displayed) Labs Reviewed - No data to display  EKG None  Radiology No results found.  Procedures Procedures (including critical care time)  Medications Ordered in ED Medications - No data to display   Initial Impression / Assessment and Plan / ED Course  I have reviewed the triage vital signs and the nursing notes.  Pertinent labs & imaging results that were available during my care of the patient were reviewed by me and considered in my medical decision making (see chart for details).  Patient with history of spinal stenosis/degenerative lumbar disc disease presenting with radicular low back pain.  There are no red flags today that would suggest an emergent situation.  I see no indication for imaging or acute neurosurgical consultation.  Patient will be treated with prednisone, pain medication, and is to follow-up with his  primary doctor in the next week if not improving.  At that point, he can discuss physical therapy or imaging studies at the discretion of his PCP.  Final Clinical Impressions(s) / ED Diagnoses   Final diagnoses:  Radicular low back pain    ED Discharge Orders         Ordered    predniSONE (DELTASONE) 10 MG tablet  2 times daily     03/01/19 1830    HYDROcodone-acetaminophen (NORCO) 5-325 MG tablet  Every 6 hours PRN     03/01/19 1830  Geoffery Lyons, MD 03/01/19 2251

## 2019-03-01 NOTE — Discharge Instructions (Signed)
Prednisone as prescribed. ° °Hydrocodone as prescribed as needed for pain. ° °Follow-up with your primary doctor if symptoms or not improving in the next week. °

## 2019-03-01 NOTE — ED Notes (Signed)
C/o rt side back pain radiating down leg x 3 weeks  Pt has hx of same

## 2021-02-12 ENCOUNTER — Other Ambulatory Visit: Payer: Self-pay | Admitting: Neurological Surgery

## 2021-02-12 DIAGNOSIS — R269 Unspecified abnormalities of gait and mobility: Secondary | ICD-10-CM

## 2021-03-04 ENCOUNTER — Other Ambulatory Visit: Payer: Self-pay

## 2021-03-10 ENCOUNTER — Ambulatory Visit
Admission: RE | Admit: 2021-03-10 | Discharge: 2021-03-10 | Disposition: A | Discharge status: Home / Self Care | Payer: Medicaid Other | Source: Ambulatory Visit | Attending: Neurological Surgery | Admitting: Neurological Surgery

## 2021-03-10 ENCOUNTER — Other Ambulatory Visit: Payer: Self-pay

## 2021-03-10 DIAGNOSIS — R269 Unspecified abnormalities of gait and mobility: Secondary | ICD-10-CM

## 2021-03-10 IMAGING — MR MR CERVICAL SPINE W/O CM
4 of 5 series · 30 of 48 positions shown · non-contrast
Comparison: None.

CLINICAL DATA: Chronic neck pain

EXAM:
MRI CERVICAL SPINE WITHOUT CONTRAST
TECHNIQUE: Multiplanar, multisequence MR imaging of the cervical spine was
performed. No intravenous contrast was administered.

[Series 3: T2 · sagittal · 3.0mm · 0.66mm/px · 8 of 18 slices shown (1 of 2)]
[im 1/18]
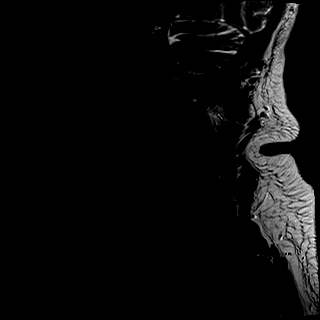
[im 3/18]
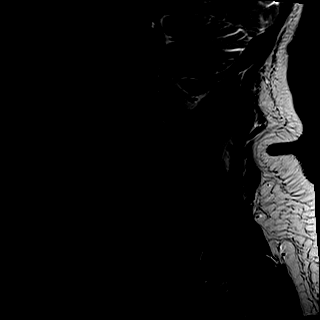
[im 5/18]
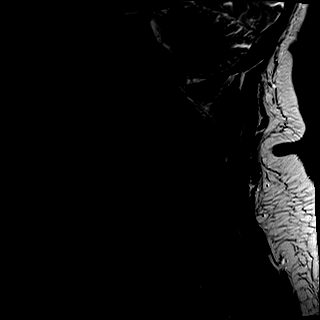
[im 8/18]
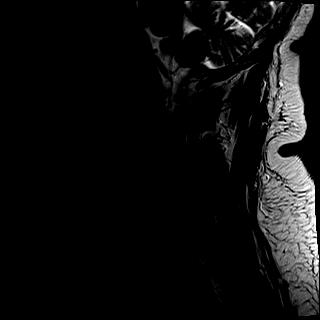
[im 10/18]
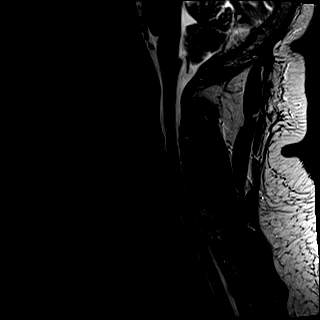
[im 13/18]
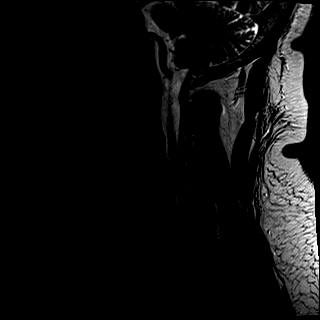
[im 15/18]
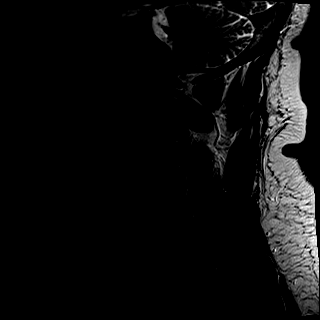
[im 18/18]
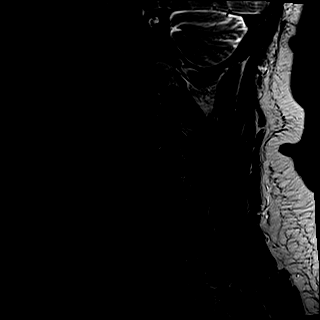

[Series 4: T1 · sagittal · 3.0mm · 0.41mm/px · 8 of 18 slices shown]
[im 1/18]
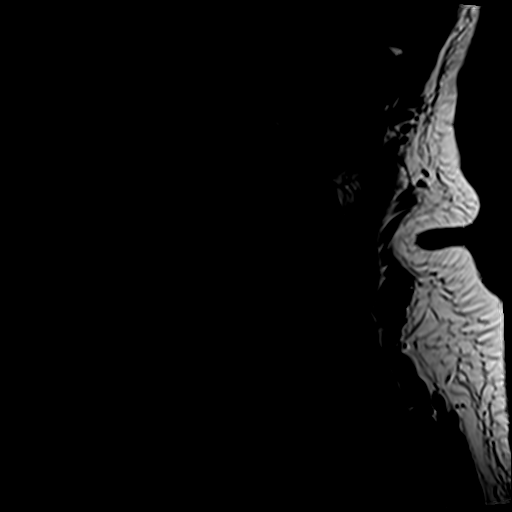
[im 3/18]
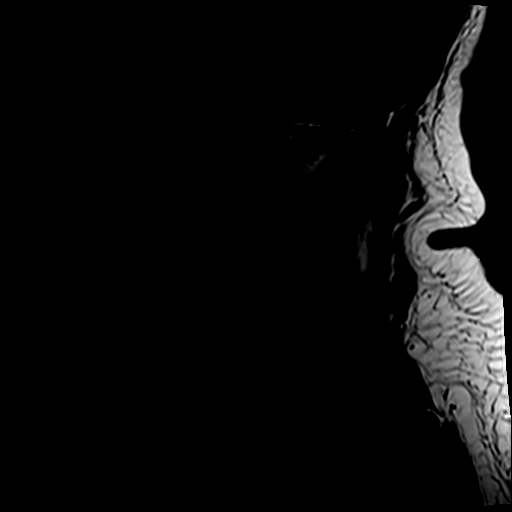
[im 5/18]
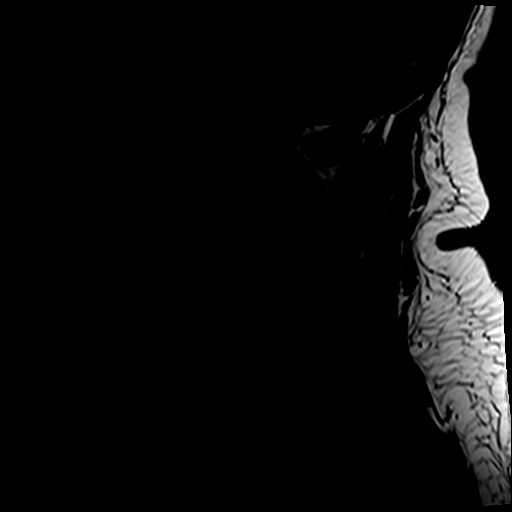
[im 8/18]
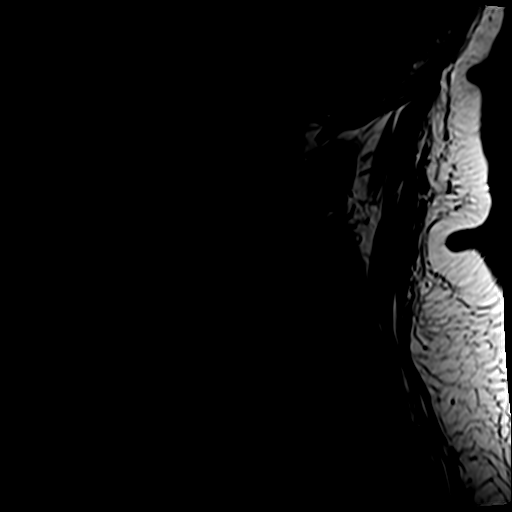
[im 10/18]
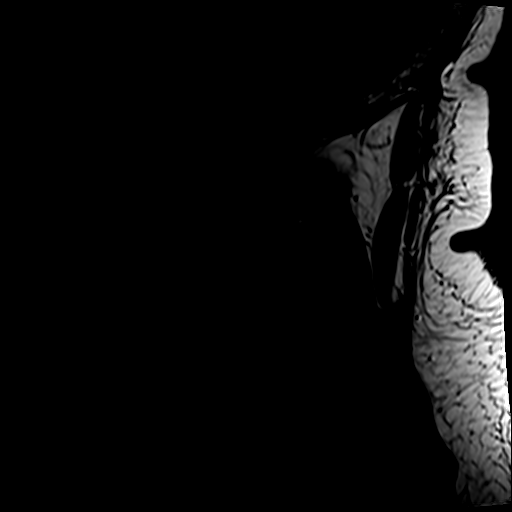
[im 13/18]
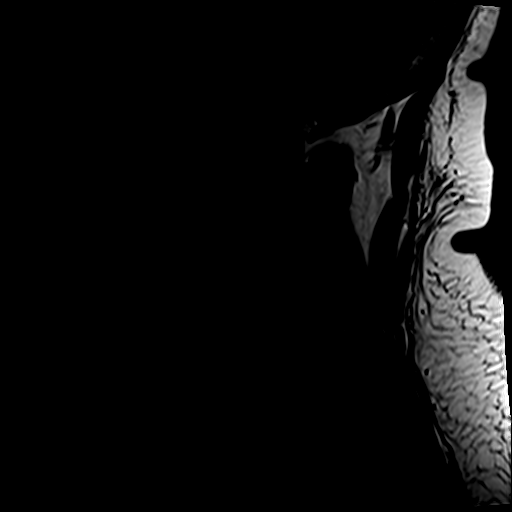
[im 15/18]
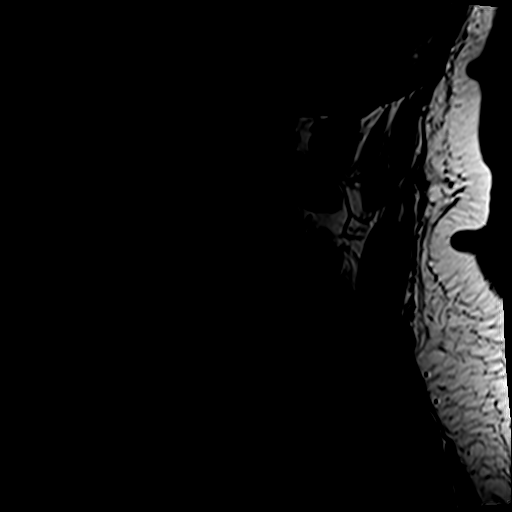
[im 18/18]
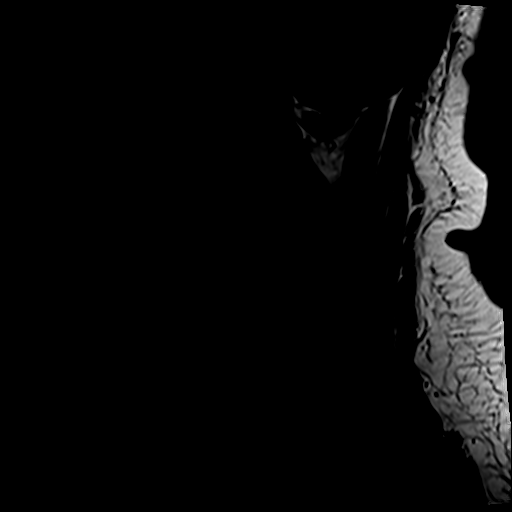

[Series 5: tir sag · sagittal · 3.0mm · 0.41mm/px · 5 of 18 slices shown]
[im 1/18]
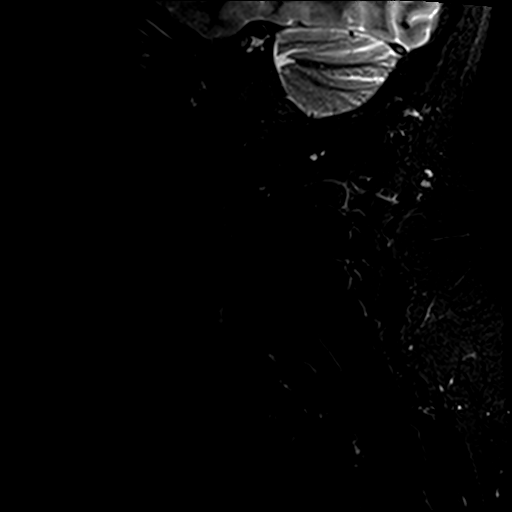
[im 3/18]
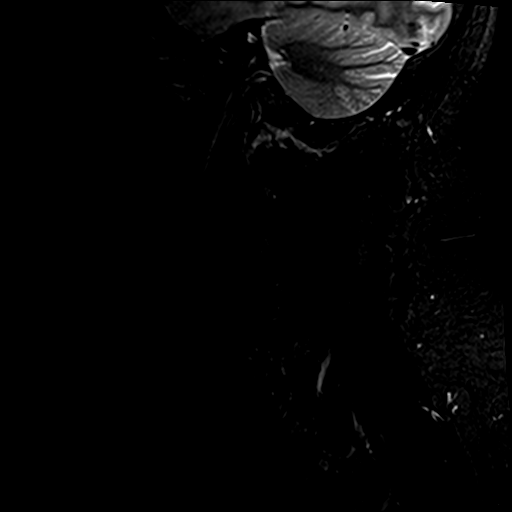
[im 5/18]
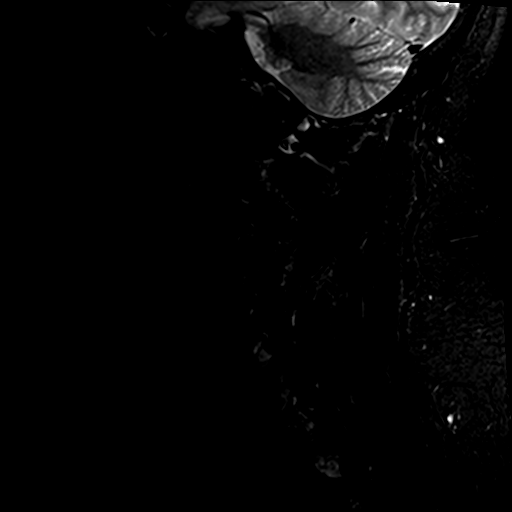
[im 10/18]
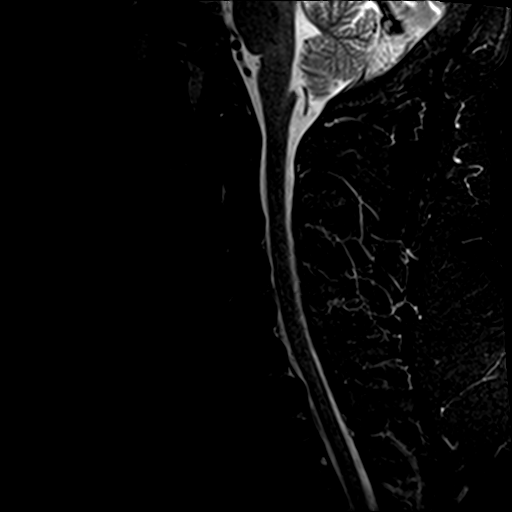
[im 15/18]
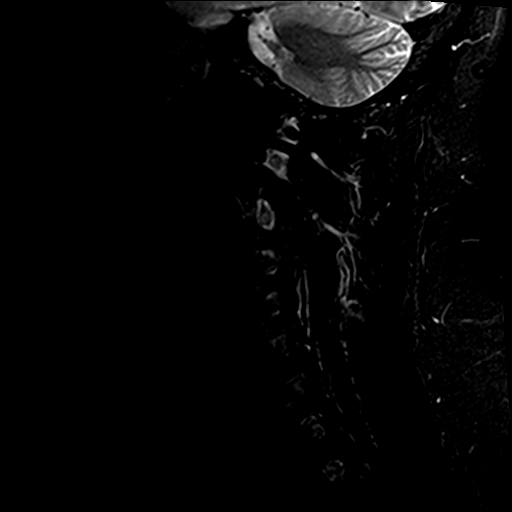

[Series 7: T2 · axial · 3.0mm · 0.70mm/px · z∈[-47,+52]mm · 9 of 29 slices shown (2 of 2)]
[im 1/29]
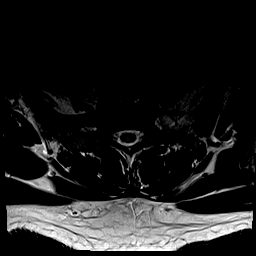
[im 6/29]
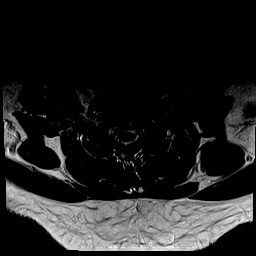
[im 8/29]
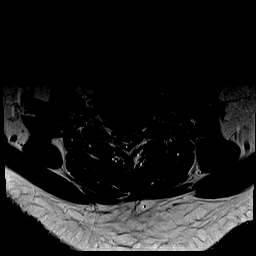
[im 13/29]
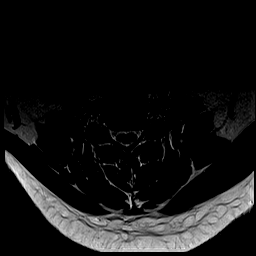
[im 16/29]
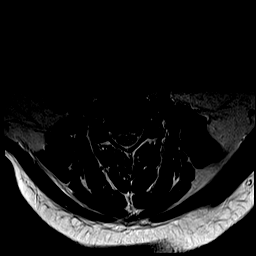
[im 21/29]
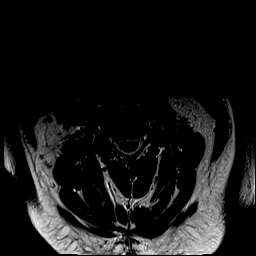
[im 23/29]
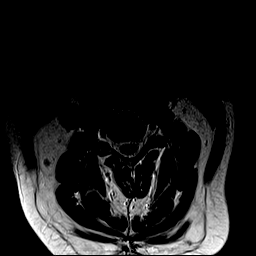
[im 26/29]
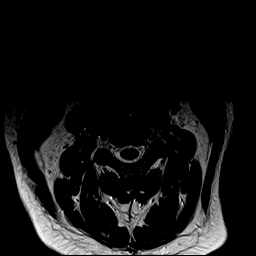
[im 29/29]
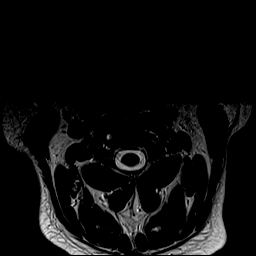

[30 of 48 positions shown; findings below may reference images not displayed]

FINDINGS: Alignment: Physiologic.

Vertebrae: No fracture, evidence of discitis, or bone lesion.

Cord: Normal signal and morphology.

Posterior Fossa, vertebral arteries, paraspinal tissues: Negative.

Disc levels:

C2-C3: No significant disc protrusion, foraminal stenosis, or canal
stenosis.

C3-C4: Mild disc osteophyte complex without foraminal or canal
stenosis.

C4-C5: Mild disc osteophyte complex and bilateral uncovertebral
spurring without foraminal or canal stenosis.

C5-C6: Mild disc osteophyte complex with bilateral uncovertebral
spurring resulting in mild left foraminal stenosis without canal
stenosis.

C6-C7: Left paracentral disc bulge with slight cranial migration of
disc material. Disc bulge contacts the ventral cord resulting in
mild canal stenosis. Borderline-mild left foraminal stenosis.

C7-T1: No significant disc protrusion, foraminal stenosis, or canal
stenosis.
IMPRESSION: Mild multilevel degenerative changes of the cervical spine, most
pronounced at C6-7 where there is mild canal stenosis and
borderline-mild left foraminal stenosis secondary to a left
paracentral disc protrusion.

## 2021-03-10 IMAGING — MR MR THORACIC SPINE W/O CM
4 of 5 series · 17 of 48 positions shown · non-contrast
Comparison: None.

CLINICAL DATA: Chronic back pain

EXAM:
MRI THORACIC SPINE WITHOUT CONTRAST
TECHNIQUE: Multiplanar, multisequence MR imaging of the thoracic spine was
performed. No intravenous contrast was administered.

[Series 4: STIR · sagittal · 3.0mm · 1.09mm/px · 3 of 19 slices shown]
[im 3/19]
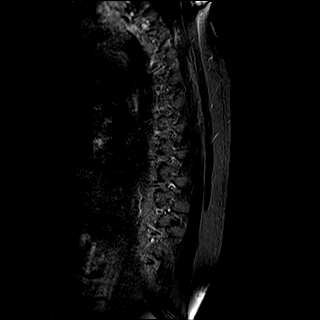
[im 11/19]
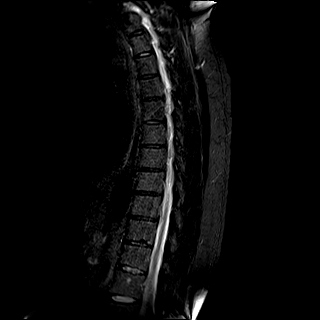
[im 16/19]
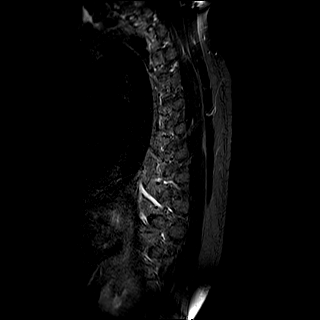

[Series 5: T2 · sagittal · 3.0mm · 0.55mm/px · 7 of 19 slices shown (1 of 2)]
[im 1/19]
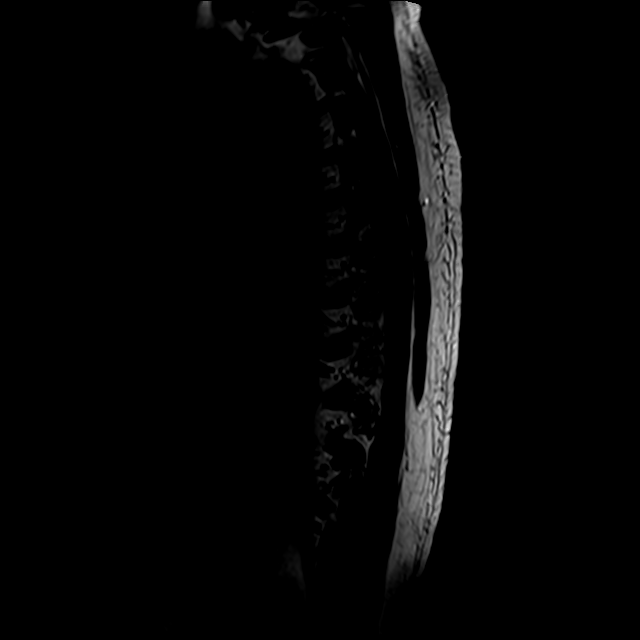
[im 4/19]
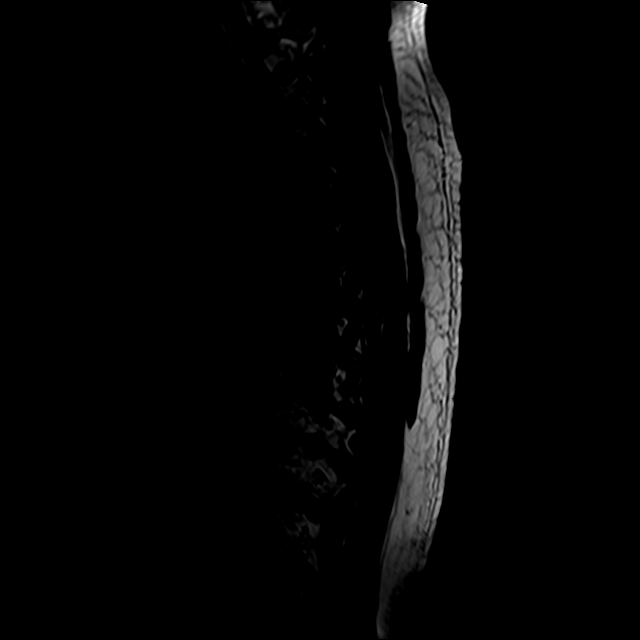
[im 7/19]
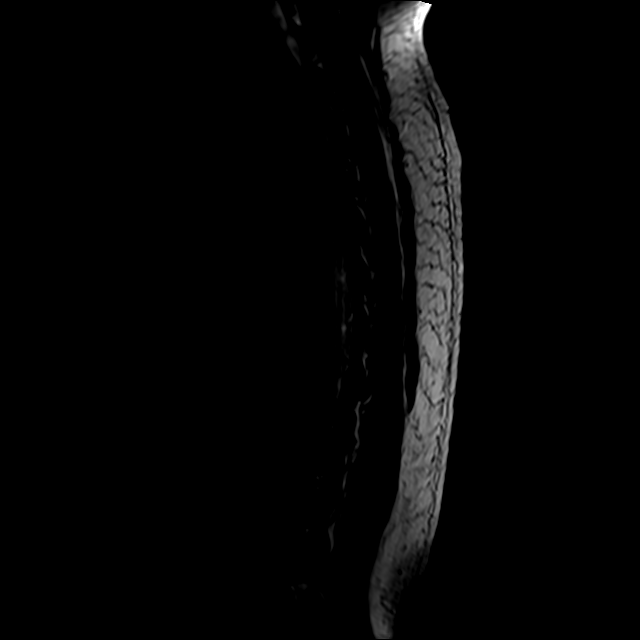
[im 10/19]
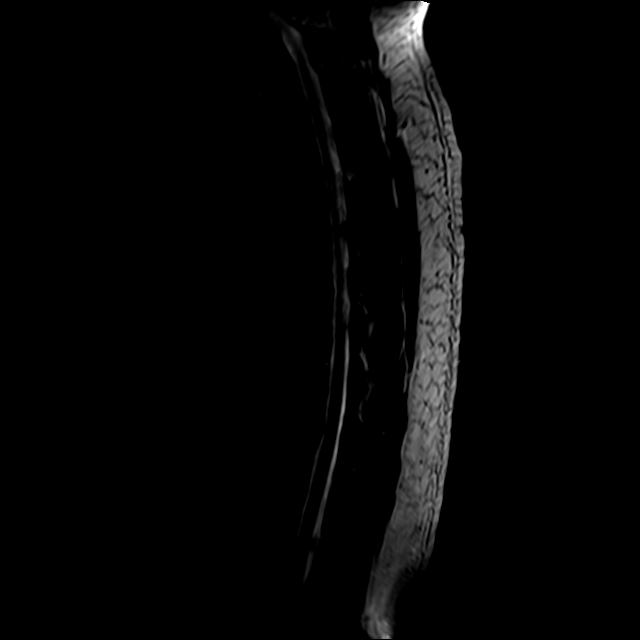
[im 13/19]
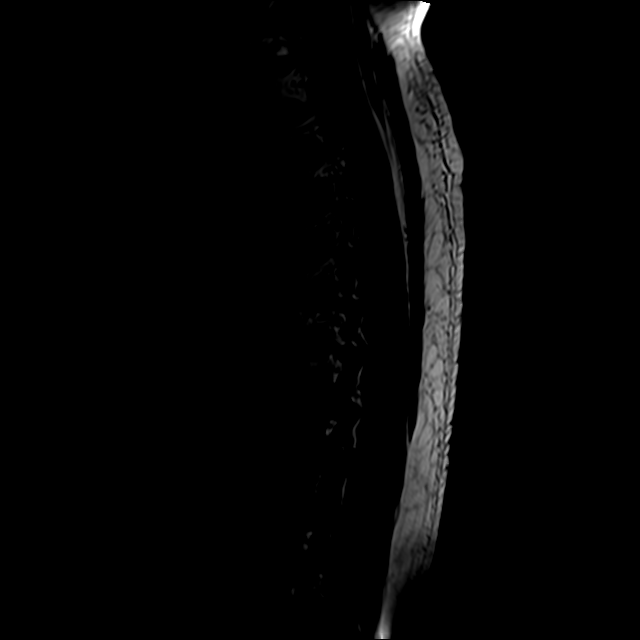
[im 16/19]
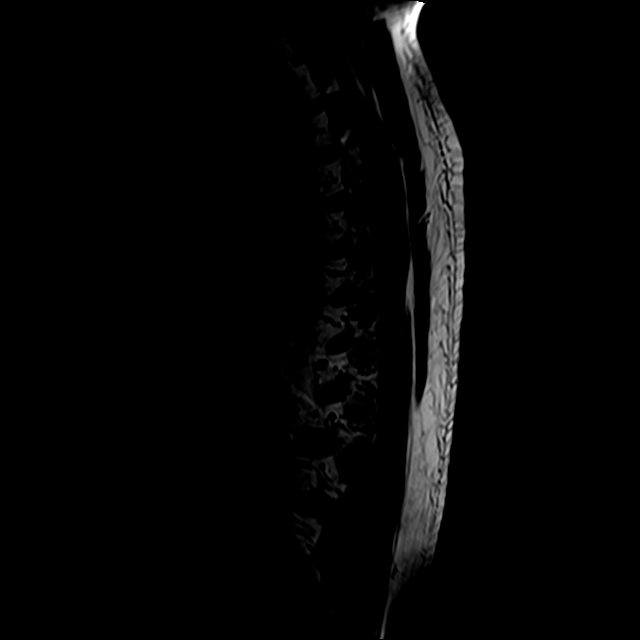
[im 19/19]
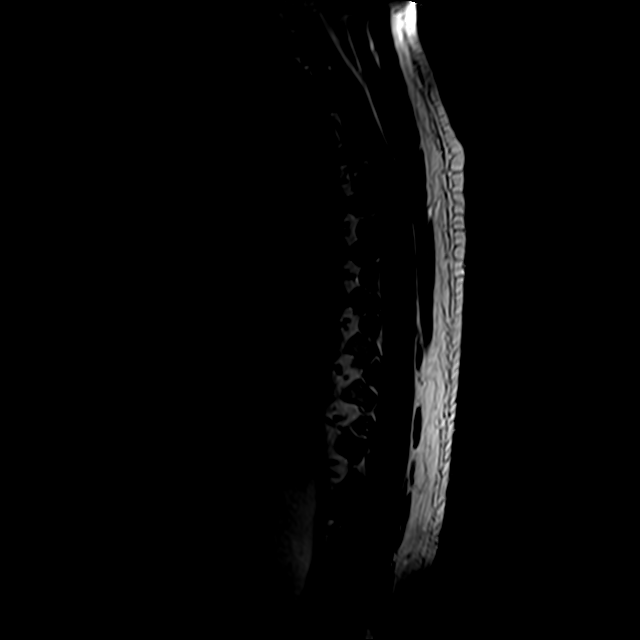

[Series 6: T1 · sagittal · 3.0mm · 0.55mm/px · 3 of 19 slices shown]
[im 4/19]
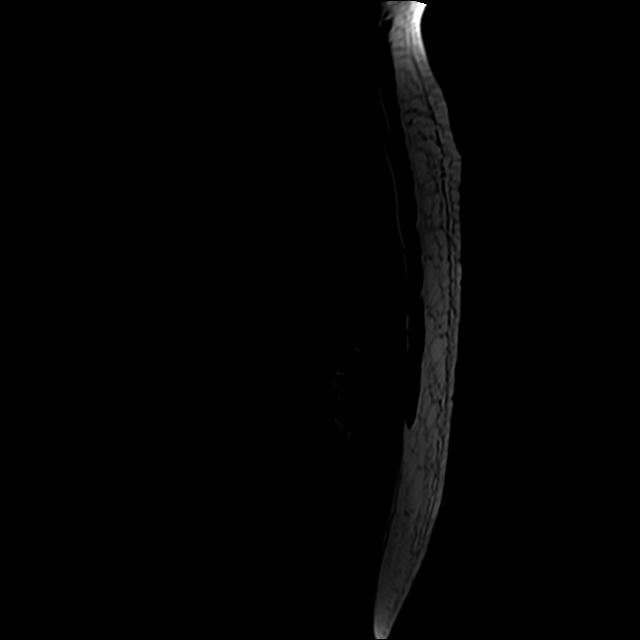
[im 10/19]
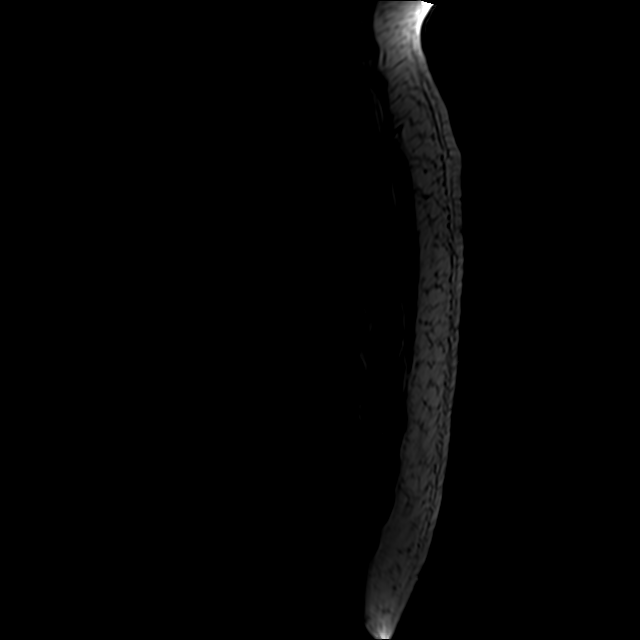
[im 16/19]
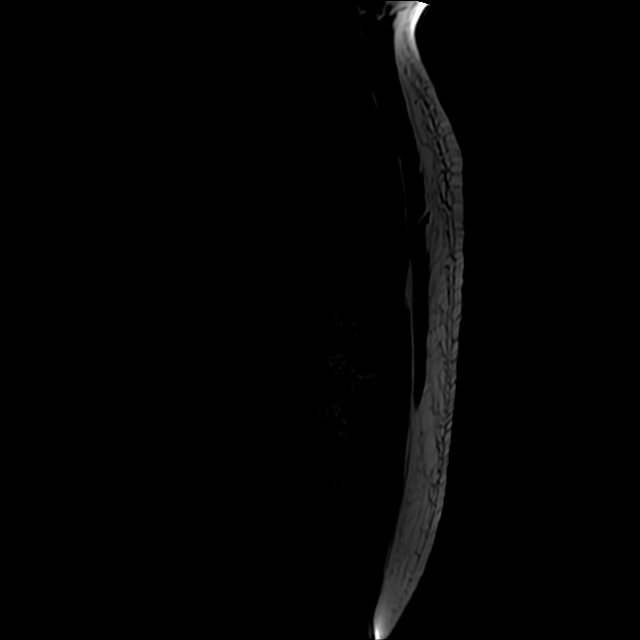

[Series 7: T2 · axial · 4.0mm · 0.39mm/px · z∈[-248,-36]mm · 4 of 36 slices shown (2 of 2)]
[im 1/36]
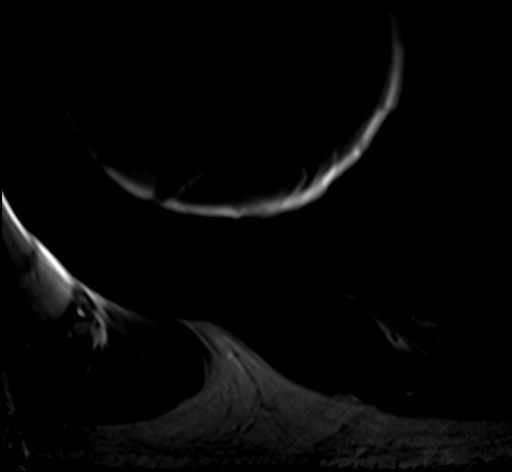
[im 6/36]
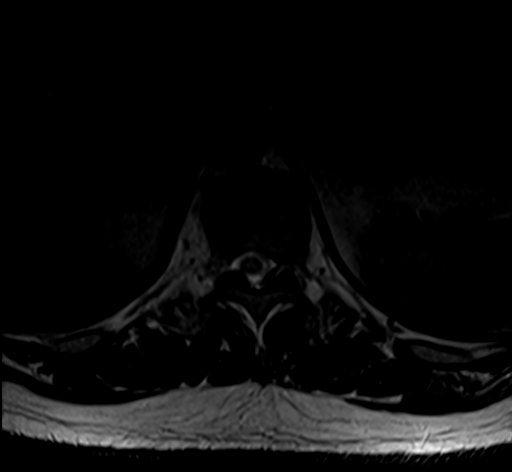
[im 18/36]
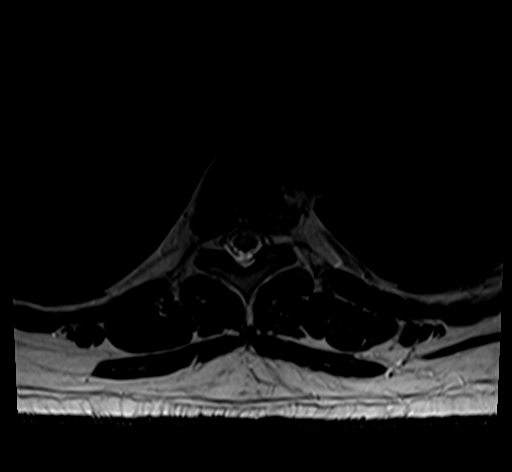
[im 30/36]
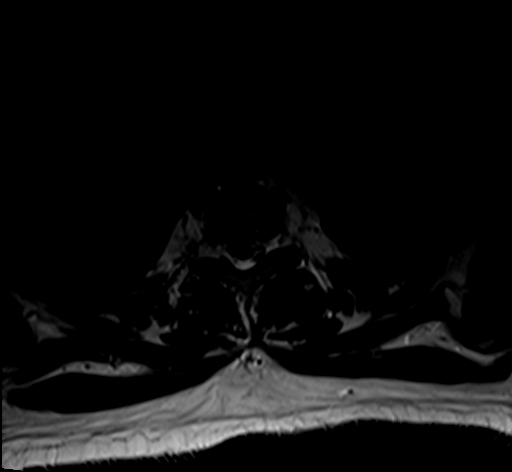

[17 of 48 positions shown; findings below may reference images not displayed]

FINDINGS: Alignment:  Physiologic alignment without static listhesis.

Vertebrae: No fracture, evidence of discitis, or bone lesion.

Cord:  Normal signal and morphology.

Paraspinal and other soft tissues: Negative.

Disc levels:

T1-T2: No significant canal or neural foraminal narrowing.

T2-T3: Asymmetric right-sided facet arthropathy. No foraminal or
canal stenosis.

T3-T4: No significant canal or neural foraminal narrowing.

T4-T5: Small left paracentral disc protrusion. No foraminal or canal
stenosis.

T5-T6: No significant canal or neural foraminal narrowing.

T6-T7: No significant canal or neural foraminal narrowing.

T7-T8: Small central disc protrusion without foraminal or canal
stenosis.

T8-T9: Small central disc protrusion without foraminal or canal
stenosis.

T9-T10: No significant canal or neural foraminal narrowing.

T10-T11: No significant canal or neural foraminal narrowing.

T11-T12: No significant canal or neural foraminal narrowing.
IMPRESSION: Mild degenerative disc disease of the thoracic spine with scattered
noncompressive disc bulges. No significant foraminal or canal
stenosis at any level.

## 2021-04-15 ENCOUNTER — Encounter (HOSPITAL_BASED_OUTPATIENT_CLINIC_OR_DEPARTMENT_OTHER): Payer: Self-pay | Admitting: Emergency Medicine

## 2021-04-15 ENCOUNTER — Emergency Department (HOSPITAL_BASED_OUTPATIENT_CLINIC_OR_DEPARTMENT_OTHER)
Admission: EM | Admit: 2021-04-15 | Discharge: 2021-04-15 | Disposition: A | Discharge status: Home / Self Care | Payer: Medicaid Other | Attending: Emergency Medicine | Admitting: Emergency Medicine

## 2021-04-15 ENCOUNTER — Other Ambulatory Visit: Payer: Self-pay

## 2021-04-15 DIAGNOSIS — G8929 Other chronic pain: Secondary | ICD-10-CM | POA: Insufficient documentation

## 2021-04-15 DIAGNOSIS — M545 Low back pain, unspecified: Secondary | ICD-10-CM

## 2021-04-15 MED ORDER — LIDOCAINE 5 % EX PTCH
1.0000 | MEDICATED_PATCH | Freq: Once | CUTANEOUS | Status: DC
  Administered 2021-04-15: 1 via TRANSDERMAL
  Filled 2021-04-15: qty 1

## 2021-04-15 MED ORDER — MORPHINE SULFATE (PF) 4 MG/ML IV SOLN
4.0000 mg | Freq: Once | INTRAVENOUS | Status: AC
Start: 2021-04-15 — End: 2021-04-15
  Administered 2021-04-15: 4 mg via INTRAMUSCULAR
  Filled 2021-04-15: qty 1

## 2021-04-15 MED ORDER — METHOCARBAMOL 500 MG PO TABS
500.0000 mg | ORAL_TABLET | Freq: Once | ORAL | Status: AC
  Administered 2021-04-15: 500 mg via ORAL
  Filled 2021-04-15: qty 1

## 2021-04-15 NOTE — ED Provider Notes (Signed)
[facility] EMERGENCY DEPARTMENT Provider Note   CSN: [id number] Arrival date & time: 04/15/21  2003     History Chief Complaint  Patient presents with  . Back Pain    Gene Hansen is a 45 y.o. male.  Gene Hansen is a 45 y.o. male with history of chronic back pain, spinal stenosis, scoliosis, neuropathy, vertigo, bleeding ulcer, kidney stones, who presents to the emergency department for evaluation of low back pain.  Patient reports he has chronic low back pain that he has struggled with for the past 5 years.  Seems to have intermittent flares of more severe pain.  Current pain has been worsening over the past 3 to 4 weeks.  Reports currently pain is present in the left low back and radiates into the left hip and buttock but does not radiate down the leg.  No associated abdominal pain.  No fevers or chills.  No dysuria or urinary frequency.  Has chronic neuropathy in both of his legs but denies any new numbness or weakness.  No loss of bowel or bladder control or saddle anesthesia.  Is ambulatory with a cane.  Reports he has been prescribed Lyrica which she does not feel is helping, took a course of steroids 2 weeks ago and did not get much improvement.  Is prescribed hydrocodone, but reports he avoids taking this because it makes him feel dizzy, he is not a candidate for NSAIDs.  No other aggravating or alleviating factors.  The history is provided by the patient.       Past Medical History:  Diagnosis Date  . Bleeding ulcer   . Depression, major   . Gout   . Kidney stone   . Lung nodule   . Neuropathy   . Neuropathy, lower extremity    bilateral lower extremities and feet  . OCD (obsessive compulsive disorder)   . Panic attacks   . Testicular mass   . Vertigo     Patient Active Problem List   Diagnosis Date Noted  . Lactic acidosis 12/14/2016  . Influenza A with respiratory manifestations 12/14/2016  . Acute respiratory failure with hypoxia (HCC) 12/13/2016   . Gout 12/13/2016  . Depression with anxiety 12/13/2016  . Pulmonary hypertension (HCC) 12/13/2016    Past Surgical History:  Procedure Laterality Date  . KIDNEY STONE SURGERY    . lung tumor     removal  . testicular tumor     removal       No family history on file.  Social History   Tobacco Use  . Smoking status: Never Smoker  . Smokeless tobacco: Never Used  Substance Use Topics  . Alcohol use: No  . Drug use: No    Home Medications Prior to Admission medications   Medication Sig Start Date End Date Taking? Authorizing Provider  acetaminophen (TYLENOL) 500 MG tablet Take 1,000 mg by mouth every 6 (six) hours as needed for headache (pain).    [provider]  albuterol (PROVENTIL HFA;VENTOLIN HFA) 108 (90 Base) MCG/ACT inhaler Inhale 2 puffs into the lungs every 6 (six) hours as needed for wheezing or shortness of breath. 12/15/16   Rolly Salter, MD  allopurinol (ZYLOPRIM) 300 MG tablet Take 600 mg by mouth daily.     [provider]  cetirizine (ZYRTEC) 10 MG tablet Take 10 mg by mouth 2 (two) times daily as needed for allergies.     [provider]  Cholecalciferol (VITAMIN D-3) 5000 units TABS  Take 5,000 Units by mouth daily.    [provider]  cyanocobalamin (,VITAMIN B-12,) 1000 MCG/ML injection Inject 1,000 mcg into the muscle every 14 (fourteen) days. Last injection 3-6 months ago    [provider]  Cyanocobalamin 1500 MCG TBDP Take 1,500 mcg by mouth daily. Vitamin B12    [provider]  guaiFENesin (MUCINEX) 600 MG 12 hr tablet Take 2 tablets (1,200 mg total) by mouth 2 (two) times daily. 12/15/16   Rolly Salter, MD  hydrochlorothiazide (HYDRODIURIL) 25 MG tablet Take 1 tablet (25 mg total) by mouth daily. Start after 1 week. 12/23/16   Rolly Salter, MD  HYDROcodone-acetaminophen (NORCO) 5-325 MG tablet Take 1-2 tablets by mouth every 6 (six) hours as needed. 03/01/19   Geoffery Lyons, MD  naproxen  (NAPROSYN) 500 MG tablet Take 1 tablet (500 mg total) by mouth 2 (two) times daily. Patient not taking: Reported on 12/13/2016 12/11/16   Palumbo, April, MD  predniSONE (DELTASONE) 10 MG tablet Take 2 tablets (20 mg total) by mouth 2 (two) times daily. 03/01/19   Geoffery Lyons, MD    Allergies    Advil [ibuprofen] and Aspirin  Review of Systems   Review of Systems  Constitutional: Negative for chills.  HENT: Negative.   Respiratory: Negative for shortness of breath.   Gastrointestinal: Negative for constipation, diarrhea, nausea and vomiting.  Genitourinary: Negative for flank pain, frequency and hematuria.  Musculoskeletal: Negative for arthralgias, gait problem, joint swelling, myalgias and neck pain.  Skin: Negative for color change, rash and wound.    Physical Exam Updated Vital Signs BP (!) 150/110 (BP Location: Left Arm)   Pulse (!) 104   Temp 98.5 F (36.9 C) (Oral)   Resp 18   Ht 6' (1.829 m)   Wt 110.7 kg   SpO2 98%   BMI 33.09 kg/m   Physical Exam Vitals and nursing note reviewed.  Constitutional:      General: He is not in acute distress.    Appearance: Normal appearance. He is well-developed. He is obese. He is not ill-appearing or diaphoretic.  HENT:     Head: Atraumatic.  Eyes:     General:        Right eye: No discharge.        Left eye: No discharge.  Cardiovascular:     Pulses:          Radial pulses are 2+ on the right side and 2+ on the left side.       Dorsalis pedis pulses are 2+ on the right side and 2+ on the left side.       Posterior tibial pulses are 2+ on the right side and 2+ on the left side.  Pulmonary:     Effort: Pulmonary effort is normal. No respiratory distress.  Abdominal:     General: Bowel sounds are normal. There is no distension.     Palpations: Abdomen is soft. There is no mass.     Tenderness: There is no abdominal tenderness. There is no guarding.     Comments: Abdomen soft, nondistended, nontender to palpation in all  quadrants without guarding or peritoneal signs, no CVA tenderness bilaterally  Musculoskeletal:     Cervical back: Neck supple.     Comments: Tenderness to palpation over left low back musculature, no midline tenderness.  Pain made worse with range of motion of the lower extremities, negative straight leg raise bilaterally  Skin:    General: Skin is warm  and dry.     Capillary Refill: Capillary refill takes less than 2 seconds.  Neurological:     Mental Status: He is alert and oriented to person, place, and time.     Comments: Alert, clear speech, following commands. Moving all extremities without difficulty. Bilateral lower extremities with 5/5 strength in proximal and distal muscle groups and with dorsi and plantar flexion. Sensation intact in bilateral lower extremities. 2+ patellar DTRs bilaterally. Ambulatory with steady gait using cane  Psychiatric:        Mood and Affect: Mood normal.        Behavior: Behavior normal.     ED Results / Procedures / Treatments   Labs (all labs ordered are listed, but only abnormal results are displayed) Labs Reviewed - No data to display  EKG None  Radiology No results found.  Procedures Procedures   Medications Ordered in ED Medications  lidocaine (LIDODERM) 5 % 1 patch (1 patch Transdermal Patch Applied 04/15/21 2119)  morphine 4 MG/ML injection 4 mg (4 mg Intramuscular Given 04/15/21 2124)  methocarbamol (ROBAXIN) tablet 500 mg (500 mg Oral Given 04/15/21 2121)    ED Course  I have reviewed the triage vital signs and the nursing notes.  Pertinent labs & imaging results that were available during my care of the patient were reviewed by me and considered in my medical decision making (see chart for details).    MDM Rules/Calculators/A&P                          Patient with back pain.  History of chronic back pain, is followed by PCP and was recently referred to neurosurgeon at [facility].  Reports an exacerbation of his chronic  back pain not responding to medications at home.  No neurological deficits and normal neuro exam.  Patient can walk but states is painful.  No loss of bowel or bladder control.  No concern for cauda equina.  No fever, night sweats, weight loss, h/o cancer, IVDU.  Pain improved here in the emergency department.  Counseled patient on continuing to use the medications prescribed to him at home, he had not been using hydrocodone because it causes some dizziness, encouraged him to try taking just a half a tablet to see if this helps, he is unable to take NSAIDs due to ulcer.  Will refer to PCP for continued pain management.  Final Clinical Impression(s) / ED Diagnoses Final diagnoses:  Acute exacerbation of chronic low back pain    Rx / DC Orders ED Discharge Orders    None       Dartha Lodge, PA-C 04/15/21 2316    Virgina Norfolk, DO 04/16/21 2153

## 2021-04-15 NOTE — Discharge Instructions (Signed)
Continue using your home medications to help treat back pain.  You can try using a half a tablet of the hydrocodone if you feel like a full tablet makes you feel dizzy.  You can continue to use the tizanidine as needed for muscle spasm, continue taking your Lyrica, and use Salonpas lidocaine patches.  You can also apply ice and/or heat to your back.  Follow-up with your primary care doctor, and the spine doctor as planned.  Return if you develop new numbness or weakness, loss of bowel or bladder control, fevers or any other new or concerning symptoms.

## 2021-04-15 NOTE — ED Triage Notes (Signed)
Reports low back pain for the last three weeks.  Endorses having a lot of back problems and that he needs a shot.  Taking lyrica at home with no relief.

## 2021-05-30 ENCOUNTER — Other Ambulatory Visit: Payer: Self-pay

## 2021-05-30 ENCOUNTER — Emergency Department (HOSPITAL_BASED_OUTPATIENT_CLINIC_OR_DEPARTMENT_OTHER)
Admission: EM | Admit: 2021-05-30 | Discharge: 2021-05-30 | Disposition: A | Discharge status: Home / Self Care | Payer: Medicaid Other | Attending: Emergency Medicine | Admitting: Emergency Medicine

## 2021-05-30 ENCOUNTER — Encounter (HOSPITAL_BASED_OUTPATIENT_CLINIC_OR_DEPARTMENT_OTHER): Payer: Self-pay | Admitting: *Deleted

## 2021-05-30 DIAGNOSIS — I10 Essential (primary) hypertension: Secondary | ICD-10-CM | POA: Insufficient documentation

## 2021-05-30 DIAGNOSIS — Z79899 Other long term (current) drug therapy: Secondary | ICD-10-CM | POA: Diagnosis not present

## 2021-05-30 DIAGNOSIS — M25552 Pain in left hip: Secondary | ICD-10-CM | POA: Diagnosis not present

## 2021-05-30 DIAGNOSIS — M545 Low back pain, unspecified: Secondary | ICD-10-CM | POA: Diagnosis not present

## 2021-05-30 DIAGNOSIS — G8929 Other chronic pain: Secondary | ICD-10-CM

## 2021-05-30 MED ORDER — MORPHINE SULFATE (PF) 4 MG/ML IV SOLN
4.0000 mg | Freq: Once | INTRAVENOUS | Status: AC
  Administered 2021-05-30: 4 mg via INTRAMUSCULAR
  Filled 2021-05-30: qty 1

## 2021-05-30 MED ORDER — CELECOXIB 100 MG PO CAPS
100.0000 mg | ORAL_CAPSULE | Freq: Two times a day (BID) | ORAL | 0 refills | Status: AC
  Filled 2021-05-30: qty 20, 10d supply, fill #0

## 2021-05-30 MED ORDER — METHOCARBAMOL 500 MG PO TABS
500.0000 mg | ORAL_TABLET | Freq: Once | ORAL | Status: AC
  Administered 2021-05-30: 500 mg via ORAL
  Filled 2021-05-30: qty 1

## 2021-05-30 NOTE — ED Triage Notes (Signed)
Sciatica.  Hx of same x months. Pt requesting morphine shot for the pain. States that he has taken muscle relaxer and tylenol without relief.

## 2021-05-30 NOTE — ED Provider Notes (Signed)
[facility] EMERGENCY DEPARTMENT Provider Note   CSN: [id number] Arrival date & time: 05/30/21  2257     History Chief Complaint  Patient presents with   Back Pain    Gene Hansen is a 45 y.o. male.  The history is provided by the patient.  Back Pain He has history of chronic back pain, peripheral neuropathy and comes in with worsening of his low back pain over the last 3 days.  Pain is in the lumbar area and into the left paralumbar area and left hip.  He has been seeing neurosurgeons and had seen a neurosurgeon earlier today.  He is being evaluated for possible spinal cord stimulator.  MRI showed multilevel disc disease but surgery is not being recommended.  He denies any unusual trauma.  He states that when his back flared up like this before, he came in and received an injection of morphine and it seemed to help quite a bit.  He also wonders if there is something similar to ibuprofen that he can take.  He cannot take ibuprofen because of a history of ulcers.   Past Medical History:  Diagnosis Date   Bleeding ulcer    Depression, major    Gout    Kidney stone    Lung nodule    Neuropathy    Neuropathy, lower extremity    bilateral lower extremities and feet   OCD (obsessive compulsive disorder)    Panic attacks    Testicular mass    Vertigo     Patient Active Problem List   Diagnosis Date Noted   Lactic acidosis 12/14/2016   Influenza A with respiratory manifestations 12/14/2016   Acute respiratory failure with hypoxia (HCC) 12/13/2016   Gout 12/13/2016   Depression with anxiety 12/13/2016   Pulmonary hypertension (HCC) 12/13/2016    Past Surgical History:  Procedure Laterality Date   KIDNEY STONE SURGERY     lung tumor     removal   testicular tumor     removal       History reviewed. No pertinent family history.  Social History   Tobacco Use   Smoking status: Never   Smokeless tobacco: Never  Substance Use Topics   Alcohol use: No    Drug use: No    Home Medications Prior to Admission medications   Medication Sig Start Date End Date Taking? Authorizing Provider  acetaminophen (TYLENOL) 500 MG tablet Take 1,000 mg by mouth every 6 (six) hours as needed for headache (pain).    [provider]  albuterol (PROVENTIL HFA;VENTOLIN HFA) 108 (90 Base) MCG/ACT inhaler Inhale 2 puffs into the lungs every 6 (six) hours as needed for wheezing or shortness of breath. 12/15/16   Rolly Salter, MD  allopurinol (ZYLOPRIM) 300 MG tablet Take 600 mg by mouth daily.     [provider]  cetirizine (ZYRTEC) 10 MG tablet Take 10 mg by mouth 2 (two) times daily as needed for allergies.     [provider]  Cholecalciferol (VITAMIN D-3) 5000 units TABS Take 5,000 Units by mouth daily.    [provider]  cyanocobalamin (,VITAMIN B-12,) 1000 MCG/ML injection Inject 1,000 mcg into the muscle every 14 (fourteen) days. Last injection 3-6 months ago    [provider]  Cyanocobalamin 1500 MCG TBDP Take 1,500 mcg by mouth daily. Vitamin B12    [provider]  guaiFENesin (MUCINEX) 600 MG 12 hr tablet Take 2 tablets (1,200 mg total) by mouth 2 (two)  times daily. 12/15/16   Rolly Salter, MD  hydrochlorothiazide (HYDRODIURIL) 25 MG tablet Take 1 tablet (25 mg total) by mouth daily. Start after 1 week. 12/23/16   Rolly Salter, MD  HYDROcodone-acetaminophen (NORCO) 5-325 MG tablet Take 1-2 tablets by mouth every 6 (six) hours as needed. 03/01/19   Geoffery Lyons, MD  naproxen (NAPROSYN) 500 MG tablet Take 1 tablet (500 mg total) by mouth 2 (two) times daily. Patient not taking: Reported on 12/13/2016 12/11/16   Palumbo, April, MD  predniSONE (DELTASONE) 10 MG tablet Take 2 tablets (20 mg total) by mouth 2 (two) times daily. 03/01/19   Geoffery Lyons, MD    Allergies    Advil [ibuprofen] and Aspirin  Review of Systems   Review of Systems  Musculoskeletal:  Positive for back pain.  All other systems  reviewed and are negative.  Physical Exam Updated Vital Signs BP (!) 173/108 (BP Location: Right Arm)   Pulse 84   Temp 98.2 F (36.8 C) (Oral)   Resp 18   Ht 6' (1.829 m)   Wt 122.5 kg   SpO2 96%   BMI 36.62 kg/m   Physical Exam Vitals and nursing note reviewed.  45 year old male, resting comfortably and in no acute distress. Vital signs are significant for elevated blood pressure. Oxygen saturation is 96%, which is normal. Head is normocephalic and atraumatic. PERRLA, EOMI. Oropharynx is clear. Neck is nontender and supple without adenopathy or JVD. Back has mild tenderness in the mid and lower lumbar area and into the left paralumbar area.  There is mild bilateral paralumbar spasm.  Straight leg raise is positive bilaterally at 45 degrees.  There is no CVA tenderness. Lungs are clear without rales, wheezes, or rhonchi. Chest is nontender. Heart has regular rate and rhythm without murmur. Abdomen is soft, flat, nontender without masses or hepatosplenomegaly and peristalsis is normoactive. Extremities have no cyanosis or edema, full range of motion is present. Skin is warm and dry without rash. Neurologic: Mental status is normal, cranial nerves are intact, strength is 5/5 in all 4 extremities s.  ED Results / Procedures / Treatments    Procedures Procedures   Medications Ordered in ED Medications  morphine 4 MG/ML injection 4 mg (4 mg Intramuscular Given 05/30/21 2343)  methocarbamol (ROBAXIN) tablet 500 mg (500 mg Oral Given 05/30/21 2342)    ED Course  I have reviewed the triage vital signs and the nursing notes.  MDM Rules/Calculators/A&P                         Exacerbation of chronic lumbar pain.  Old records reviewed confirming multiple recent evaluations by neurosurgery with recommendations to proceed with spinal cord stimulator rather than spinal fusion.  Also, a recent ED visit where he received a single injection of morphine.  He is given a dose of morphine as  well as a dose of methocarbamol.  I have discussed with him potential risks and benefits of using a Cox 2 inhibitor which is less likely to make his ulcer flareup but still has potential for GI complications.  He is given a prescription for short course of celecoxib to see if it is helping and if it is tolerated.  Also, I have discussed with him his elevated blood pressure.  Blood pressure has been elevated fairly consistently in recent visits.  Recommended that he get a machine to monitor his blood pressure at home, and work with his primary  care provider to make appropriate changes in his blood pressure regimen.  Final Clinical Impression(s) / ED Diagnoses Final diagnoses:  Acute exacerbation of chronic low back pain  Elevated blood pressure reading with diagnosis of hypertension    Rx / DC Orders ED Discharge Orders          Ordered    celecoxib (CELEBREX) 100 MG capsule  2 times daily        05/30/21 2338             Dione Booze, MD 05/30/21 2348

## 2021-05-30 NOTE — Discharge Instructions (Addendum)
You have been given a prescription for celecoxib (Celebrex).  Although this is less harsh on your stomach than ibuprofen, it still may cause your ulcer to flareup.  If you start having any stomach distress at all, stop taking the celecoxib.  Your blood pressure was elevated today.  Please get a machine to check your blood pressure at home.  You should check your blood pressure every day and keep a record of it.  If your blood pressure is not being controlled adequately, you need to have your medications adjusted.  Inadequately controlled blood pressure can lead to heart attacks, strokes, kidney failure.

## 2021-05-31 ENCOUNTER — Other Ambulatory Visit (HOSPITAL_BASED_OUTPATIENT_CLINIC_OR_DEPARTMENT_OTHER): Payer: Self-pay

## 2022-08-04 ENCOUNTER — Encounter (HOSPITAL_BASED_OUTPATIENT_CLINIC_OR_DEPARTMENT_OTHER): Payer: Self-pay

## 2022-08-04 ENCOUNTER — Emergency Department (HOSPITAL_BASED_OUTPATIENT_CLINIC_OR_DEPARTMENT_OTHER)
Admission: EM | Admit: 2022-08-04 | Discharge: 2022-08-04 | Disposition: A | Discharge status: Home / Self Care | Payer: Medicaid Other | Attending: Emergency Medicine | Admitting: Emergency Medicine

## 2022-08-04 DIAGNOSIS — H938X2 Other specified disorders of left ear: Secondary | ICD-10-CM | POA: Diagnosis not present

## 2022-08-04 DIAGNOSIS — Z79899 Other long term (current) drug therapy: Secondary | ICD-10-CM | POA: Diagnosis not present

## 2022-08-04 DIAGNOSIS — H9202 Otalgia, left ear: Secondary | ICD-10-CM | POA: Diagnosis present

## 2022-08-04 HISTORY — DX: Scoliosis, unspecified: M41.9

## 2022-08-04 HISTORY — DX: Spinal stenosis, site unspecified: M48.00

## 2022-08-04 NOTE — ED Triage Notes (Signed)
Pt ambulatory to ED with c/o L ear pain x2 weeks. Pt reports 4 weeks ago he was told R ear had fluid and was given Augmentin. R ear improved and now L one hurts.

## 2022-08-04 NOTE — ED Provider Notes (Signed)
[facility] EMERGENCY DEPARTMENT Provider Note   CSN: [id number] Arrival date & time: 08/04/22  0401     History  Chief Complaint  Patient presents with   Otalgia    Gene Hansen is a 46 y.o. male.  The history is provided by the patient.  Otalgia Gene Hansen is a 46 y.o. male who presents to the Emergency Department complaining of ear discomfort.  He presents to the emergency department for evaluation of left-sided ear discomfort that has been ongoing for the last several weeks.  He describes it as a full sensation.  Over the last few days he now has discomfort on the left side of his head.  Several weeks ago he had an ear infection on the right side and is concerned that he may have an ear infection on the left now.  No fevers, cough, shortness of breath, numbness, weakness, nausea, vomiting.  He does report mild watery eyes.  He has a history of peptic ulcer disease, degenerative disc disease.     Home Medications Prior to Admission medications   Medication Sig Start Date End Date Taking? Authorizing Provider  amLODipine (NORVASC) 10 MG tablet Take by mouth. 05/15/17  Yes [provider]  pregabalin (LYRICA) 100 MG capsule Take by mouth. 04/11/21  Yes [provider]  acetaminophen (TYLENOL) 500 MG tablet Take 1,000 mg by mouth every 6 (six) hours as needed for headache (pain).    [provider]  albuterol (PROVENTIL HFA;VENTOLIN HFA) 108 (90 Base) MCG/ACT inhaler Inhale 2 puffs into the lungs every 6 (six) hours as needed for wheezing or shortness of breath. 12/15/16   Rolly Salter, MD  allopurinol (ZYLOPRIM) 300 MG tablet Take 600 mg by mouth daily.     [provider]  celecoxib (CELEBREX) 100 MG capsule Take 1 capsule (100 mg total) by mouth 2 (two) times daily. 05/30/21   Dione Booze, MD  cetirizine (ZYRTEC) 10 MG tablet Take 10 mg by mouth 2 (two) times daily as needed for allergies.     [provider]   Cholecalciferol (VITAMIN D-3) 5000 units TABS Take 5,000 Units by mouth daily.    [provider]  cyanocobalamin (,VITAMIN B-12,) 1000 MCG/ML injection Inject 1,000 mcg into the muscle every 14 (fourteen) days. Last injection 3-6 months ago    [provider]  Cyanocobalamin 1500 MCG TBDP Take 1,500 mcg by mouth daily. Vitamin B12    [provider]  guaiFENesin (MUCINEX) 600 MG 12 hr tablet Take 2 tablets (1,200 mg total) by mouth 2 (two) times daily. 12/15/16   Rolly Salter, MD  hydrochlorothiazide (HYDRODIURIL) 25 MG tablet Take 1 tablet (25 mg total) by mouth daily. Start after 1 week. 12/23/16   Rolly Salter, MD  HYDROcodone-acetaminophen (NORCO) 5-325 MG tablet Take 1-2 tablets by mouth every 6 (six) hours as needed. 03/01/19   Geoffery Lyons, MD  metoprolol succinate (TOPROL-XL) 50 MG 24 hr tablet Take by mouth.    [provider]  valsartan (DIOVAN) 320 MG tablet Take by mouth.    [provider]      Allergies    Advil [ibuprofen] and Aspirin    Review of Systems   Review of Systems  HENT:  Positive for ear pain.   All other systems reviewed and are negative.   Physical Exam Updated Vital Signs BP (!) 180/125 (BP Location: Right Arm)   Pulse 85   Temp 97.9 F (36.6 C) (Oral)  Resp 20   Ht 6' (1.829 m)   Wt 122.5 kg   SpO2 98%   BMI 36.62 kg/m  Physical Exam Vitals and nursing note reviewed.  Constitutional:      Appearance: He is well-developed.  HENT:     Head: Normocephalic and atraumatic.     Comments: TMs clear bilaterally without any significant erythema of the canal bilaterally.  No erythema to the TMs.  No erythema or edema in the posterior oropharynx. Cardiovascular:     Rate and Rhythm: Normal rate and regular rhythm.  Pulmonary:     Effort: Pulmonary effort is normal. No respiratory distress.  Musculoskeletal:        General: No tenderness.     Cervical back: Neck supple.  Skin:    General: Skin is warm  and dry.  Neurological:     Mental Status: He is alert and oriented to person, place, and time.  Psychiatric:        Behavior: Behavior normal.     ED Results / Procedures / Treatments   Labs (all labs ordered are listed, but only abnormal results are displayed) Labs Reviewed - No data to display  EKG None  Radiology No results found.  Procedures Procedures    Medications Ordered in ED Medications - No data to display  ED Course/ Medical Decision Making/ A&P                           Medical Decision Making  Patient here for evaluation of left-sided ear discomfort.  He is nontoxic-appearing on evaluation.  No evidence of acute bacterial infection on examination.  Discussed with patient potential eustachian tube dysfunction, symptoms may be triggered by allergies.  Discussed OTC allergy medications and outpatient follow-up.        Final Clinical Impression(s) / ED Diagnoses Final diagnoses:  Sensation of fullness in left ear    Rx / DC Orders ED Discharge Orders     None         Quintella Reichert, MD 08/04/22 [phone]

## 2022-08-04 NOTE — Discharge Instructions (Signed)
You can take Flonase OR Nasacort, available over-the-counter according to label instructions.

## 2023-07-03 ENCOUNTER — Other Ambulatory Visit: Payer: Self-pay

## 2023-07-03 ENCOUNTER — Emergency Department (HOSPITAL_BASED_OUTPATIENT_CLINIC_OR_DEPARTMENT_OTHER)
Admission: EM | Admit: 2023-07-03 | Discharge: 2023-07-03 | Disposition: A | Discharge status: Home / Self Care | Payer: Medicaid Other | Attending: Emergency Medicine | Admitting: Emergency Medicine

## 2023-07-03 ENCOUNTER — Encounter (HOSPITAL_BASED_OUTPATIENT_CLINIC_OR_DEPARTMENT_OTHER): Payer: Self-pay | Admitting: Emergency Medicine

## 2023-07-03 DIAGNOSIS — Z79899 Other long term (current) drug therapy: Secondary | ICD-10-CM | POA: Diagnosis not present

## 2023-07-03 DIAGNOSIS — H1131 Conjunctival hemorrhage, right eye: Secondary | ICD-10-CM | POA: Insufficient documentation

## 2023-07-03 DIAGNOSIS — I10 Essential (primary) hypertension: Secondary | ICD-10-CM | POA: Insufficient documentation

## 2023-07-03 DIAGNOSIS — H5789 Other specified disorders of eye and adnexa: Secondary | ICD-10-CM | POA: Diagnosis present

## 2023-07-03 MED ORDER — POLYMYXIN B-TRIMETHOPRIM 10000-0.1 UNIT/ML-% OP SOLN: 2.0000 [drp] | Freq: Four times a day (QID) | OPHTHALMIC | 0 refills | Status: AC

## 2023-07-03 NOTE — Discharge Instructions (Addendum)
You were seen in the emergency department today for an eye problem.  As we discussed it looks like you have a subconjunctival hemorrhage of your eye.  This is some blood pooling in the white part of your eye.  This should resolve on its own in about a week or 2.  I am giving you some antibiotic eyedrops to prevent secondary infection.  I have attached the contact information for the eye doctor if you would like to try to call and follow-up with them.  I have also given you a resource guide for dentists in the area as you requested.

## 2023-07-03 NOTE — ED Provider Notes (Signed)
[place] [facility]ERG[facility] DEPARTMENT AT [facility] Provider Note   CSN: [id number] Arrival date & time: 07/03/23  1804     History  Chief Complaint  Patient presents with   Eye Problem    Gene Hansen is a 47 y.o. male with history of kidney stones, vertigo, gout, OCD, depression, panic attacks, neuropathy, scoliosis, spinal stenosis, hypertension, who presents to the emergency department complaining of redness to his right eye.  Patient states that last night he started noticing some bleeding to the white part of his right eye.  Denies any trauma to the area that he knows of.  Denies any vision changes.  Denies any pain.   Eye Problem Associated symptoms: redness        Home Medications Prior to Admission medications   Medication Sig Start Date End Date Taking? Authorizing Provider  trimethoprim-polymyxin b (POLYTRIM) ophthalmic solution Place 2 drops into the right eye every 6 (six) hours for 7 days. 07/03/23 07/10/23 Yes Javona Bergevin T, PA-C  acetaminophen (TYLENOL) 500 MG tablet Take 1,000 mg by mouth every 6 (six) hours as needed for headache (pain).    [provider]  albuterol (PROVENTIL HFA;VENTOLIN HFA) 108 (90 Base) MCG/ACT inhaler Inhale 2 puffs into the lungs every 6 (six) hours as needed for wheezing or shortness of breath. 12/15/16   Rolly Salter, MD  allopurinol (ZYLOPRIM) 300 MG tablet Take 600 mg by mouth daily.     [provider]  amLODipine (NORVASC) 10 MG tablet Take by mouth. 05/15/17   [provider]  celecoxib (CELEBREX) 100 MG capsule Take 1 capsule (100 mg total) by mouth 2 (two) times daily. 05/30/21   Dione Booze, MD  cetirizine (ZYRTEC) 10 MG tablet Take 10 mg by mouth 2 (two) times daily as needed for allergies.     [provider]  Cholecalciferol (VITAMIN D-3) 5000 units TABS Take 5,000 Units by mouth daily.    [provider]  cyanocobalamin (,VITAMIN B-12,) 1000 MCG/ML injection Inject 1,000  mcg into the muscle every 14 (fourteen) days. Last injection 3-6 months ago    [provider]  Cyanocobalamin 1500 MCG TBDP Take 1,500 mcg by mouth daily. Vitamin B12    [provider]  guaiFENesin (MUCINEX) 600 MG 12 hr tablet Take 2 tablets (1,200 mg total) by mouth 2 (two) times daily. 12/15/16   Rolly Salter, MD  hydrochlorothiazide (HYDRODIURIL) 25 MG tablet Take 1 tablet (25 mg total) by mouth daily. Start after 1 week. 12/23/16   Rolly Salter, MD  HYDROcodone-acetaminophen (NORCO) 5-325 MG tablet Take 1-2 tablets by mouth every 6 (six) hours as needed. 03/01/19   Geoffery Lyons, MD  metoprolol succinate (TOPROL-XL) 50 MG 24 hr tablet Take by mouth.    [provider]  pregabalin (LYRICA) 100 MG capsule Take by mouth. 04/11/21   [provider]  valsartan (DIOVAN) 320 MG tablet Take by mouth.    [provider]      Allergies    Advil [ibuprofen], Aspirin, and Gabapentin    Review of Systems   Review of Systems  Eyes:  Positive for redness.  All other systems reviewed and are negative.   Physical Exam Updated Vital Signs BP (!) 173/119 (BP Location: Left Arm)   Pulse 74   Temp 97.7 F (36.5 C)   Resp 18   Ht 6' (1.829 m)   Wt 121.6 kg   SpO2 98%   BMI 36.35 kg/m  Physical  Exam Vitals and nursing note reviewed.  Constitutional:      Appearance: Normal appearance.  HENT:     Head: Normocephalic and atraumatic.  Eyes:     General: Lids are normal.     Extraocular Movements: Extraocular movements intact.     Conjunctiva/sclera:     Right eye: Right conjunctiva is not injected. Hemorrhage present. No chemosis or exudate.    Left eye: Left conjunctiva is not injected. No chemosis, exudate or hemorrhage.    Comments: No trauma to the area, low concern for globe rupture or corneal injury  Pulmonary:     Effort: Pulmonary effort is normal. No respiratory distress.  Skin:    General: Skin is warm and dry.  Neurological:      Mental Status: He is alert.  Psychiatric:        Mood and Affect: Mood normal.        Behavior: Behavior normal.     ED Results / Procedures / Treatments   Labs (all labs ordered are listed, but only abnormal results are displayed) Labs Reviewed - No data to display  EKG None  Radiology No results found.  Procedures Procedures    Medications Ordered in ED Medications - No data to display  ED Course/ Medical Decision Making/ A&P                                 Medical Decision Making Risk Prescription drug management.   Patient is a 47 year old male with history of kidney stones, vertigo, gout, OCD, depression, panic attacks, neuropathy, scoliosis, spinal stenosis, hypertension, who presents to the emergency department complaining of redness to his right eye.    On exam patient is hypertensive, otherwise normal vital signs.  No acute distress.  Not complaining of any pain to his eyes, normal EOM.  PERRLA.  Right inner conjunctival hemorrhage.  No hyphema.  Will give reassurance about the conjunctival hemorrhage.  Will prescribe some antibiotic eyedrops to prevent secondary infection.  Given ophthalmology follow-up.  Patient also requesting assistance finding a dentist, given dental resources.  Patient stable for discharge to home, and all questions answered.  Final Clinical Impression(s) / ED Diagnoses Final diagnoses:  Subconjunctival hemorrhage of right eye    Rx / DC Orders ED Discharge Orders          Ordered    trimethoprim-polymyxin b (POLYTRIM) ophthalmic solution  Every 6 hours        07/03/23 2148           Portions of this report may have been transcribed using voice recognition software. Every effort was made to ensure accuracy; however, inadvertent computerized transcription errors may be present.    Jeanella Flattery 07/03/23 2152    Vanetta Mulders, MD 07/03/23 [phone]

## 2023-07-03 NOTE — ED Triage Notes (Signed)
Pt reports redness (bleeding) to inner RT eye since yesterday; no trauma; no vision changes

## 2023-09-17 ENCOUNTER — Other Ambulatory Visit: Payer: Self-pay | Admitting: Orthopedic Surgery

## 2023-09-17 DIAGNOSIS — M4807 Spinal stenosis, lumbosacral region: Secondary | ICD-10-CM

## 2023-09-20 ENCOUNTER — Ambulatory Visit: Payer: Medicaid Other

## 2023-09-20 DIAGNOSIS — M48061 Spinal stenosis, lumbar region without neurogenic claudication: Secondary | ICD-10-CM | POA: Diagnosis not present

## 2023-09-20 DIAGNOSIS — M4807 Spinal stenosis, lumbosacral region: Secondary | ICD-10-CM

## 2024-01-28 ENCOUNTER — Encounter: Payer: Self-pay | Admitting: Physical Medicine and Rehabilitation

## 2024-02-06 ENCOUNTER — Other Ambulatory Visit: Payer: Self-pay | Admitting: Physical Medicine and Rehabilitation

## 2024-02-06 ENCOUNTER — Encounter: Attending: Physical Medicine and Rehabilitation | Admitting: Physical Medicine and Rehabilitation

## 2024-02-06 ENCOUNTER — Telehealth: Payer: Self-pay | Admitting: Physical Medicine and Rehabilitation

## 2024-02-06 VITALS — BP 181/122 | HR 71 | Ht 72.0 in | Wt 288.0 lb

## 2024-02-06 DIAGNOSIS — M545 Low back pain, unspecified: Secondary | ICD-10-CM | POA: Insufficient documentation

## 2024-02-06 DIAGNOSIS — H8110 Benign paroxysmal vertigo, unspecified ear: Secondary | ICD-10-CM | POA: Insufficient documentation

## 2024-02-06 DIAGNOSIS — E669 Obesity, unspecified: Secondary | ICD-10-CM | POA: Diagnosis present

## 2024-02-06 DIAGNOSIS — R7303 Prediabetes: Secondary | ICD-10-CM | POA: Diagnosis present

## 2024-02-06 DIAGNOSIS — I272 Pulmonary hypertension, unspecified: Secondary | ICD-10-CM | POA: Insufficient documentation

## 2024-02-06 DIAGNOSIS — I160 Hypertensive urgency: Secondary | ICD-10-CM | POA: Insufficient documentation

## 2024-02-06 DIAGNOSIS — M419 Scoliosis, unspecified: Secondary | ICD-10-CM | POA: Diagnosis not present

## 2024-02-06 MED ORDER — TIZANIDINE HCL 4 MG PO TABS: 2.0000 mg | ORAL_TABLET | Freq: Every day | ORAL | 0 refills | Status: DC

## 2024-02-06 MED ORDER — BUPRENORPHINE 5 MCG/HR TD PTWK: 1.0000 | MEDICATED_PATCH | TRANSDERMAL | 3 refills | Status: AC

## 2024-02-06 MED ORDER — LIDOCAINE 5 % EX PTCH: 1.0000 | MEDICATED_PATCH | CUTANEOUS | 0 refills | Status: DC

## 2024-02-06 MED ORDER — JOURNAVX 50 MG PO TABS: 1.0000 | ORAL_TABLET | Freq: Two times a day (BID) | ORAL | 0 refills | Status: DC | PRN

## 2024-02-06 NOTE — Patient Instructions (Signed)
Insomnia: -Try to go outside near sunrise -Get exercise during the day.  -Turn off all devices an hour before bedtime.  -Teas that can benefit: chamomile, valerian root, Brahmi (Bacopa) -Can consider over the counter melatonin, magnesium, and/or L-theanine. Melatonin is an anti-oxidant with multiple health benefits. Magnesium is involved in greater than 300 enzymatic reactions in the body and most of us are deficient as our soil is often depleted. There are 7 different types of magnesium- Bioptemizer's is a supplement with all 7 types, and each has unique benefits. Magnesium can also help with constipation and anxiety.  -Pistachios naturally increase the production of melatonin -Cozy Earth bamboo bed sheets are free from toxic chemicals.  -Tart cherry juice or a tart cherry supplement can improve sleep and soreness post-workout   

## 2024-02-06 NOTE — Progress Notes (Signed)
 Subjective:    Patient ID: Gene Hansen, male    DOB: 11/22/1975, 48 y.o.   MRN: [id number]  HPI Gene Hansen is a 48 year old man who presents to establish care for low back pain.  1) Low back pain: -failed injections, medial branch block, gabapentin, tylenol; cannot take NSAIDs/aspirin given bleeding ulcer in the past, PT; has been told that he is not a good surgical candidate given that his spine is too narrow; failed trazodone to help him sleep; he has been given oxycodone and this also exacerbates his vertigo -has been diagnosed with scoliosis, spinal stenosis, bone spurs, disc degeneration -he is planning to follow at [facility] for eval for spinal cord stimulator  2) HTN: -BP is 181/122 today -says his PCP has given him blood pressure medication  3) Vertigo: -has never did PT for this  Pain Inventory Average Pain 10 Pain Right Now 10 My pain is sharp, burning, dull, stabbing, tingling, and aching  In the last 24 hours, has pain interfered with the following? General activity 10 Relation with others 10 Enjoyment of life 10 What TIME of day is your pain at its worst? morning , daytime, evening, and night Sleep (in general) Poor  Pain is worse with: walking, bending, sitting, inactivity, standing, unsure, and some activites Pain improves with:  nothing Relief from Meds: 0  walk with assistance use a cane how many minutes can you walk? 30 ability to climb steps?  no do you drive?  yes  disabled: date disabled SSI 2023  weakness numbness tingling trouble walking depression anxiety  New pt  New pt    No family history on file. Social History   Socioeconomic History   Marital status: Single    Spouse name: Not on file   Number of children: Not on file   Years of education: Not on file   Highest education level: Not on file  Occupational History   Not on file  Tobacco Use   Smoking status: Never   Smokeless tobacco: Never  Substance and Sexual Activity    Alcohol use: No   Drug use: No   Sexual activity: Not on file  Other Topics Concern   Not on file  Social History Narrative   Not on file   Social Drivers of Health   Financial Resource Strain: Not on file  Food Insecurity: No Food Insecurity (10/18/2022)   Received from [facility]   Hunger Vital Sign    Worried About Running Out of Food in the Last Year: Never true    Ran Out of Food in the Last Year: Never true  Transportation Needs: Not on file  Physical Activity: Not on file  Stress: No Stress Concern Present (08/17/2021)   Received from [facility], [facility] - Occupational Stress Questionnaire    Feeling of Stress : Not at all  Social Connections: Unknown (03/18/2022)   Received from [facility]   Social Network    Social Network: Not on file   Past Surgical History:  Procedure Laterality Date   KIDNEY STONE SURGERY     lung tumor     removal   testicular tumor     removal   Past Medical History:  Diagnosis Date   Bleeding ulcer    Depression, major    Gout    Kidney stone    Lung nodule    Neuropathy    Neuropathy, lower extremity  bilateral lower extremities and feet   OCD (obsessive compulsive disorder)    Panic attacks    Scoliosis    Spinal stenosis    Testicular mass    Vertigo    BP (!) 181/122   Pulse 71   Ht 6' (1.829 m)   Wt 288 lb (130.6 kg)   SpO2 95%   BMI 39.06 kg/m   Opioid Risk Score:   Fall Risk Score:  `1  Depression screen [facility] 2/9     02/06/2024   10:56 AM  Depression screen PHQ 2/9  Decreased Interest 0  Down, Depressed, Hopeless 0  PHQ - 2 Score 0  Altered sleeping 3  Tired, decreased energy 3  Change in appetite 0  Feeling bad or failure about yourself  0  Trouble concentrating 1  Moving slowly or fidgety/restless 0  Suicidal thoughts 0  PHQ-9 Score 7  Difficult doing work/chores Very difficult     Review of Systems  Musculoskeletal:   Positive for back pain and gait problem.       Bilateral leg pain Whole back side of body pain  Neurological:  Positive for weakness and numbness.  Psychiatric/Behavioral:  Positive for dysphoric mood. The patient is nervous/anxious.   All other systems reviewed and are negative.     Objective:   Physical Exam Gen: no distress, normal appearing HEENT: oral mucosa pink and moist, NCAT Cardio: Reg rate Chest: normal effort, normal rate of breathing Abd: soft, non-distended Ext: no edema Psych: pleasant, normal affect Skin: intact Neuro: Alert and oriented x3 MSK: 4/5 strength in bilateral lower extremities      Assessment & Plan:   1) Neuropathy: -Discussed Qutenza as an option for neuropathic pain control. Discussed that this is a capsaicin patch, stronger than capsaicin cream. Discussed that it is currently approved for diabetic peripheral neuropathy and post-herpetic neuralgia, but that it has also shown benefit in treating other forms of neuropathy. Provided patient with link to site to learn more about the patch: https://www.clark.biz/. Discussed that the patch would be placed in office and benefits usually last 3 months. Discussed that unintended exposure to capsaicin can cause severe irritation of eyes, mucous membranes, respiratory tract, and skin, but that Qutenza is a local treatment and does not have the systemic side effects of other nerve medications. Discussed that there may be pain, itching, erythema, and decreased sensory function associated with the application of Qutenza. Side effects usually subside within 1 week. A cold pack of analgesic medications can help with these side effects. Blood pressure can also be increased due to pain associated with administration of the patch.   2) Low back pain 2/2 scoliosis, disc degeneration -discussed that he has failed PT, oxycodone, gabapentin, cannot tolerate NSAIDs due to bleeding ulcer in the past, discussed that he has tried  salonpas -butrans refilled -lidocaine patch ordered  -discussed that Journavx is a highly selective inhibitor for Nav 1.8, which is specific for pain in the peripheral nervous system, discussed that lidocaine in contrast affects all Nav receptors, discussed loading dose is 2 50mg  tablets and this can be taken 1 hour before food, discussed that then 50mg  can be taken with out without food q12H until pain is tolerable. Discussed that potential side effects are pruritus, muscle spasms, increase blood creatinine, rash. Discussed that we have samples available and we have copay cards available. Discussed that the 2 phase 3 clinicial trials sent to the FDA were for abdominoplasty and bunionectomy, discussed that it has been  currently studied for 14 days, discussed that it can reduce efficacy of opioids and benzodiazepines, discussed that it has not be studied in severe hepatic impairment. Discussed that it is contraindicated with fluconazole. Discussed that it can interfere with hormonal contraception up to 28 days after use. Discussed that it can decrease fertility, it has not been studied in pregnancy, that it has been present in animal milk during lactation, not recommended for GFR <15, discussed that outpatient if the medication requires a prior auth the copay should be $30 for at least a 60 day supply. The medication may be more likely to be in stock in [facility] and [facility]. We do have samples available  3) HTN/hypertensive urgency -recommended going to the ED given how high BP is -tizanidine 2mg  prescribed HS -Advised checking BP daily at home and logging results to bring into follow-up appointment with PCP and myself. -Reviewed BP meds today.  -Advised regarding healthy foods that can help lower blood pressure and provided with a list: 1) citrus foods- high in vitamins and minerals 2) salmon and other fatty fish - reduces inflammation and oxylipins 3) swiss chard (leafy green)- high level of nitrates 4)  pumpkin seeds- one of the best natural sources of magnesium 5) Beans and lentils- high in fiber, magnesium, and potassium 6) Berries- high in flavonoids 7) Amaranth (whole grain, can be cooked similarly to rice and oats)- high in magnesium and fiber 8) Pistachios- even more effective at reducing BP than other nuts 9) Carrots- high in phenolic compounds that relax blood vessels and reduce inflammation 10) Celery- contain phthalides that relax tissues of arterial walls 11) Tomatoes- can also improve cholesterol and reduce risk of heart disease 12) Broccoli- good source of magnesium, calcium, and potassium 13) Greek yogurt: high in potassium and calcium 14) Herbs and spices: Celery seed, cilantro, saffron, lemongrass, black cumin, ginseng, cinnamon, cardamom, sweet basil, and ginger 15) Chia and flax seeds- also help to lower cholesterol and blood sugar 16) Beets- high levels of nitrates that relax blood vessels  17) spinach and bananas- high in potassium  -Provided lise of supplements that can help with hypertension:  1) magnesium: one high quality brand is Bioptemizers since it contains all 7 types of magnesium, otherwise over the counter magnesium gluconate 400mg  is a good option 2) B vitamins 3) vitamin D 4) potassium 5) CoQ10 6) L-arginine 7) Vitamin C 8) Beetroot -Educated that goal BP is 120/80. -Made goal to incorporate some of the above foods into diet.    4) Vertigo: -discussed that gabapentin and oxycodone worsen this  5) Insomnia: -recommended following up with pulmonology to assess for sleep apnea -discussed that if has sleep apnea this can make it hard to lose weight -Try to go outside near sunrise -Get exercise during the day.  -Turn off all devices an hour before bedtime.  -Teas that can benefit: chamomile, valerian root, Brahmi (Bacopa) -Can consider over the counter melatonin, magnesium, and/or L-theanine. Melatonin is an anti-oxidant with multiple health benefits.  Magnesium is involved in greater than 300 enzymatic reactions in the body and most of [place] are deficient as our soil is often depleted. There are 7 different types of magnesium- Bioptemizer's is a supplement with all 7 types, and each has unique benefits. Magnesium can also help with constipation and anxiety.  -Pistachios naturally increase the production of melatonin -Cozy Earth bamboo bed sheets are free from toxic chemicals.  -Tart cherry juice or a tart cherry supplement can improve sleep and soreness post-workout  6) Obesity: -discussed that he has been trying to eat a healthy diet

## 2024-02-06 NOTE — Telephone Encounter (Signed)
 Patient called stating he need prior auth on Butrans and lidoderm patch.

## 2024-02-06 NOTE — Addendum Note (Signed)
 Addended by: Horton Chin on: 02/06/2024 12:35 PM   Modules accepted: Orders

## 2024-02-10 NOTE — Telephone Encounter (Signed)
 Prior auths sent to [facility] via CoverMyMeds for both lidocaine patches(Key: [id number])  and buprenorphine patches (butrans).(Key: [id number])

## 2024-02-11 ENCOUNTER — Encounter: Payer: Self-pay | Admitting: *Deleted

## 2024-02-11 NOTE — Telephone Encounter (Addendum)
 Outcome Approved on March 25 by [facility] Medicaid 2017 NCP[place] Request Reference Number: [id number]. LIDOCAINE PAD 5% is approved through 02/09/2025. For further questions, call [employer] at [phone]. Effective Date: 02/10/2024 Authorization Expiration Date: 02/09/2025  Butrans Patches approved 02/10/24 - 05/12/24

## 2024-02-13 NOTE — Telephone Encounter (Signed)
 Letter from insurance approving Butrans patch faxed to [employer].

## 2024-02-21 ENCOUNTER — Ambulatory Visit
Admission: RE | Admit: 2024-02-21 | Discharge: 2024-02-21 | Disposition: A | Discharge status: Home / Self Care | Source: Ambulatory Visit | Attending: Physical Medicine and Rehabilitation | Admitting: Physical Medicine and Rehabilitation

## 2024-02-21 DIAGNOSIS — H8110 Benign paroxysmal vertigo, unspecified ear: Secondary | ICD-10-CM

## 2024-03-15 ENCOUNTER — Telehealth: Payer: Self-pay | Admitting: Physical Medicine and Rehabilitation

## 2024-03-15 NOTE — Telephone Encounter (Signed)
 Pt called in requesting to know if we have received MRI results

## 2024-03-16 ENCOUNTER — Encounter: Payer: Self-pay | Admitting: *Deleted

## 2024-03-16 NOTE — Telephone Encounter (Signed)
 Mychart message sent with Dr Lendell Quarry reply.

## 2024-03-19 ENCOUNTER — Encounter: Attending: Physical Medicine and Rehabilitation | Admitting: Physical Medicine and Rehabilitation

## 2024-03-19 DIAGNOSIS — R42 Dizziness and giddiness: Secondary | ICD-10-CM | POA: Diagnosis not present

## 2024-03-22 NOTE — Progress Notes (Signed)
 Subjective:    Patient ID: Gene Hansen, male    DOB: Aug 29, 1976, 48 y.o.   MRN: [id number]  HPI An audio/video tele-health visit is felt to be the most appropriate encounter for this patient at this time. This is a follow up tele-visit via phone. The patient is at home. MD is at office. Prior to scheduling this appointment, our staff discussed the limitations of evaluation and management by telemedicine and the availability of in-person appointments. The patient expressed understanding and agreed to proceed.   Gene Hansen is a 48 year old man who presents to establish care for low back pain.  1) Low back pain: -failed injections, medial branch block, gabapentin, tylenol ; cannot take NSAIDs/aspirin given bleeding ulcer in the past, PT; has been told that he is not a good surgical candidate given that his spine is too narrow; failed trazodone to help him sleep; he has been given oxycodone and this also exacerbates his vertigo -has been diagnosed with scoliosis, spinal stenosis, bone spurs, disc degeneration -he is planning to follow at [facility] for eval for spinal cord stimulator  2) HTN: -BP is 181/122 today -says his PCP has given him blood pressure medication  3) Vertigo: -has never did PT for this -discussed MRI brain results  Pain Inventory Average Pain 10 Pain Right Now 10 My pain is sharp, burning, dull, stabbing, tingling, and aching  In the last 24 hours, has pain interfered with the following? General activity 10 Relation with others 10 Enjoyment of life 10 What TIME of day is your pain at its worst? morning , daytime, evening, and night Sleep (in general) Poor  Pain is worse with: walking, bending, sitting, inactivity, standing, unsure, and some activites Pain improves with:  nothing Relief from Meds: 0  walk with assistance use a cane how many minutes can you walk? 30 ability to climb steps?  no do you drive?  yes  disabled: date disabled SSI  2023  weakness numbness tingling trouble walking depression anxiety  New pt  New pt    No family history on file. Social History   Socioeconomic History   Marital status: Single    Spouse name: Not on file   Number of children: Not on file   Years of education: Not on file   Highest education level: Not on file  Occupational History   Not on file  Tobacco Use   Smoking status: Never   Smokeless tobacco: Never  Substance and Sexual Activity   Alcohol use: No   Drug use: No   Sexual activity: Not on file  Other Topics Concern   Not on file  Social History Narrative   Not on file   Social Drivers of Health   Financial Resource Strain: Not on file  Food Insecurity: No Food Insecurity (10/18/2022)   Received from [facility]   Hunger Vital Sign    Worried About Running Out of Food in the Last Year: Never true    Ran Out of Food in the Last Year: Never true  Transportation Needs: Not on file  Physical Activity: Not on file  Stress: No Stress Concern Present (08/17/2021)   Received from [facility], [facility]   [employer] [facility] - Occupational Stress Questionnaire    Feeling of Stress : Not at all  Social Connections: Unknown (03/18/2022)   [facility] from [facility], [facility]   Social Network    Social Network: Not on file   Past Surgical History:  Procedure Laterality Date   KIDNEY STONE SURGERY     lung tumor     removal   testicular tumor     removal   Past Medical History:  Diagnosis Date   Bleeding ulcer    Depression, major    Gout    Kidney stone    Lung nodule    Neuropathy    Neuropathy, lower extremity    bilateral lower extremities and feet   OCD (obsessive compulsive disorder)    Panic attacks    Scoliosis    Spinal stenosis    Testicular mass    Vertigo    There were no vitals taken for this visit.  Opioid Risk Score:   Fall Risk Score:  `1  Depression screen [facility] 2/9     02/06/2024    10:56 AM  Depression screen PHQ 2/9  Decreased Interest 0  Down, Depressed, Hopeless 0  PHQ - 2 Score 0  Altered sleeping 3  Tired, decreased energy 3  Change in appetite 0  Feeling bad or failure about yourself  0  Trouble concentrating 1  Moving slowly or fidgety/restless 0  Suicidal thoughts 0  PHQ-9 Score 7  Difficult doing work/chores Very difficult     Review of Systems  Musculoskeletal:  Positive for back pain and gait problem.       Bilateral leg pain Whole back side of body pain  Neurological:  Positive for weakness and numbness.  Psychiatric/Behavioral:  Positive for dysphoric mood. The patient is nervous/anxious.   All other systems reviewed and are negative.      Objective:   Physical Exam Gen: no distress, normal appearing HEENT: oral mucosa pink and moist, NCAT Cardio: Reg rate Chest: normal effort, normal rate of breathing Abd: soft, non-distended Ext: no edema Psych: pleasant, normal affect Skin: intact Neuro: Alert and oriented x3 MSK: 4/5 strength in bilateral lower extremities      Assessment & Plan:   1) Neuropathy: -Discussed Qutenza as an option for neuropathic pain control. Discussed that this is a capsaicin patch, stronger than capsaicin cream. Discussed that it is currently approved for diabetic peripheral neuropathy and post-herpetic neuralgia, but that it has also shown benefit in treating other forms of neuropathy. Provided patient with link to site to learn more about the patch: https://www.clark.biz/. Discussed that the patch would be placed in office and benefits usually last 3 months. Discussed that unintended exposure to capsaicin can cause severe irritation of eyes, mucous membranes, respiratory tract, and skin, but that Qutenza is a local treatment and does not have the systemic side effects of other nerve medications. Discussed that there may be pain, itching, erythema, and decreased sensory function associated with the application of  Qutenza. Side effects usually subside within 1 week. A cold pack of analgesic medications can help with these side effects. Blood pressure can also be increased due to pain associated with administration of the patch.   2) Low back pain 2/2 scoliosis, disc degeneration -discussed that he has failed PT, oxycodone, gabapentin, cannot tolerate NSAIDs due to bleeding ulcer in the past, discussed that he has tried salonpas -butrans  refilled -lidocaine  patch ordered  -discussed that Journavx is a highly selective inhibitor for Nav 1.8, which is specific for pain in the peripheral nervous system, discussed that lidocaine  in contrast affects all Nav receptors, discussed loading dose is 2 50mg  tablets and this can be taken 1 hour before food, discussed that then 50mg  can be taken with out without food q12H  until pain is tolerable. Discussed that potential side effects are pruritus, muscle spasms, increase blood creatinine, rash. Discussed that we have samples available and we have copay cards available. Discussed that the 2 phase 3 clinicial trials sent to the FDA were for abdominoplasty and bunionectomy, discussed that it has been currently studied for 14 days, discussed that it can reduce efficacy of opioids and benzodiazepines, discussed that it has not be studied in severe hepatic impairment. Discussed that it is contraindicated with fluconazole. Discussed that it can interfere with hormonal contraception up to 28 days after use. Discussed that it can decrease fertility, it has not been studied in pregnancy, that it has been present in animal milk during lactation, not recommended for GFR <15, discussed that outpatient if the medication requires a prior auth the copay should be $30 for at least a 60 day supply. The medication may be more likely to be in stock in [facility] and [facility]. We do have samples available  3) HTN/hypertensive urgency -recommended going to the ED given how high BP is -tizanidine  2mg   prescribed HS -Advised checking BP daily at home and logging results to bring into follow-up appointment with PCP and myself. -Reviewed BP meds today.  -Advised regarding healthy foods that can help lower blood pressure and provided with a list: 1) citrus foods- high in vitamins and minerals 2) salmon and other fatty fish - reduces inflammation and oxylipins 3) swiss chard (leafy green)- high level of nitrates 4) pumpkin seeds- one of the best natural sources of magnesium  5) Beans and lentils- high in fiber, magnesium , and potassium 6) Berries- high in flavonoids 7) Amaranth (whole grain, can be cooked similarly to rice and oats)- high in magnesium  and fiber 8) Pistachios- even more effective at reducing BP than other nuts 9) Carrots- high in phenolic compounds that relax blood vessels and reduce inflammation 10) Celery- contain phthalides that relax tissues of arterial walls 11) Tomatoes- can also improve cholesterol and reduce risk of heart disease 12) Broccoli- good source of magnesium , calcium, and potassium 13) Greek yogurt: high in potassium and calcium 14) Herbs and spices: Celery seed, cilantro, saffron, lemongrass, black cumin, ginseng, cinnamon, cardamom, sweet basil, and ginger 15) Chia and flax seeds- also help to lower cholesterol and blood sugar 16) Beets- high levels of nitrates that relax blood vessels  17) spinach and bananas- high in potassium  -Provided lise of supplements that can help with hypertension:  1) magnesium : one high quality brand is Bioptemizers since it contains all 7 types of magnesium , otherwise over the counter magnesium  gluconate 400mg  is a good option 2) B vitamins 3) vitamin D 4) potassium 5) CoQ10 6) L-arginine 7) Vitamin C 8) Beetroot -Educated that goal BP is 120/80. -Made goal to incorporate some of the above foods into diet.    4) Vertigo: -discussed that gabapentin and oxycodone worsen this -discussed MRI brain results, discussed that  there are no acute abnormalities, discussed that there is chronic small vessel ischemic changes  5) Insomnia: -recommended following up with pulmonology to assess for sleep apnea -discussed that if has sleep apnea this can make it hard to lose weight -Try to go outside near sunrise -Get exercise during the day.  -Turn off all devices an hour before bedtime.  -Teas that can benefit: chamomile, valerian root, Brahmi (Bacopa) -Can consider over the counter melatonin, magnesium , and/or L-theanine. Melatonin is an anti-oxidant with multiple health benefits. Magnesium  is involved in greater than 300 enzymatic reactions in the body and most  of us  are deficient as our soil is often depleted. There are 7 different types of magnesium - Bioptemizer's is a supplement with all 7 types, and each has unique benefits. Magnesium  can also help with constipation and anxiety.  -Pistachios naturally increase the production of melatonin -Cozy Earth bamboo bed sheets are free from toxic chemicals.  -Tart cherry juice or a tart cherry supplement can improve sleep and soreness post-workout  6) Obesity: -discussed that he has been trying to eat a healthy diet  5 minutes spent in discussion of MRI brain results, discussed that there are no acute abnormalities, discussed that there is chronic small vessel ischemic changes

## 2024-04-01 ENCOUNTER — Telehealth: Payer: Self-pay

## 2024-04-01 ENCOUNTER — Encounter: Admitting: Physical Medicine and Rehabilitation

## 2024-04-01 DIAGNOSIS — M792 Neuralgia and neuritis, unspecified: Secondary | ICD-10-CM

## 2024-04-01 MED ORDER — QUTENZA (4 PATCH) 8 % EX KIT: 4.0000 | PACK | Freq: Once | CUTANEOUS | 0 refills | Status: AC

## 2024-04-01 NOTE — Telephone Encounter (Signed)
 Rx sent to [facility]

## 2024-04-01 NOTE — Telephone Encounter (Signed)
(  Key: [facility]) PA Case ID #: [id number] Submitted forn  Butrans

## 2024-04-05 NOTE — Telephone Encounter (Signed)
 Previously approved [id number]

## 2024-04-06 NOTE — Telephone Encounter (Signed)
(  Key: [id number]) PA Case ID #: [id number] Submitted for Qutenza

## 2024-04-08 ENCOUNTER — Other Ambulatory Visit: Payer: Self-pay | Admitting: Physical Medicine and Rehabilitation

## 2024-06-01 ENCOUNTER — Encounter: Attending: Physical Medicine and Rehabilitation | Admitting: Physical Medicine and Rehabilitation

## 2024-06-01 ENCOUNTER — Encounter: Payer: Self-pay | Admitting: Physical Medicine and Rehabilitation

## 2024-06-01 VITALS — BP 162/100 | HR 83 | Ht 72.0 in | Wt 276.6 lb

## 2024-06-01 DIAGNOSIS — M792 Neuralgia and neuritis, unspecified: Secondary | ICD-10-CM | POA: Insufficient documentation

## 2024-06-01 MED ORDER — CAPSAICIN-CLEANSING GEL 8 % EX KIT
4.0000 | PACK | Freq: Once | CUTANEOUS | Status: AC
  Administered 2024-06-01: 4 via TOPICAL

## 2024-06-01 MED ORDER — JOURNAVX 50 MG PO TABS: 1.0000 | ORAL_TABLET | Freq: Two times a day (BID) | ORAL | 0 refills | Status: AC | PRN

## 2024-06-01 MED ORDER — TIZANIDINE HCL 4 MG PO TABS: 4.0000 mg | ORAL_TABLET | Freq: Three times a day (TID) | ORAL | 0 refills | Status: DC | PRN

## 2024-06-01 NOTE — Patient Instructions (Signed)
 Hibiscus tea twice per day

## 2024-06-01 NOTE — Progress Notes (Signed)
-  Discussed Qutenza  as an option for neuropathic pain control. Discussed that this is a capsaicin  patch, stronger than capsaicin  cream. Discussed that it is currently approved for diabetic peripheral neuropathy and post-herpetic neuralgia, but that it has also shown benefit in treating other forms of neuropathy. Provided patient with link to site to learn more about the patch: https://www.qutenza .com/. Discussed that the patch would be placed in office and benefits usually last 3 months. Discussed that unintended exposure to capsaicin  can cause severe irritation of eyes, mucous membranes, respiratory tract, and skin, but that Qutenza  is a local treatment and does not have the systemic side effects of other nerve medications. Discussed that there may be pain, itching, erythema, and decreased sensory function associated with the application of Qutenza . Side effects usually subside within 1 week. A cold pack of analgesic medications can help with these side effects. Blood pressure can also be increased due to pain associated with administration of the patch.   4 patches of Qutenza  (35359) was applied to cervical, thoracic, and lumbar spine, and dorsum of both feet. Ice packs were applied during the procedure to ensure patient comfort. Blood pressure was monitored every 15 minutes. The patient tolerated the procedure well. Post-procedure instructions were given and follow-up has been scheduled.  Topical system measures 14cm x20cm (280cm for a total 1120units) were applied which will cause deeper penetration for destruction of the peripheral nerve using a chemical (Qutenza ) which infuses into the skin like an injection and heat technique (occlusive, compressive dressing cauing endothermic heat technique)

## 2024-06-02 ENCOUNTER — Telehealth: Payer: Self-pay

## 2024-06-02 NOTE — Telephone Encounter (Signed)
 Fax confirmation rec'd and placed in tray to be scanned in patients chart.

## 2024-06-02 NOTE — Telephone Encounter (Signed)
 Called patient to confirm that he did receive a coupon for Journavx .  Rec'd a PA from pharmacy and wanted to confirm patient understood that if we submit a PA then that will void the coupon that patient rec'd during office visit.    Patient aware to take coupon when going to pick up rx.   Patient verbalized understanding and states that he will not be able to pick up medication until he gets his monthly check.  Faxed coupon to [facility] to ensure that have a copy of coupon on file as well [phone] fax for [facility]

## 2024-06-16 NOTE — Telephone Encounter (Signed)
 Follow up on rx for Journavx .  Medication not covered by insurance. No action needed.  Patient aware that he will pay out of pocket for the rx and has a copy of coupon to carry to pharmacy.

## 2024-09-02 ENCOUNTER — Encounter: Payer: Self-pay | Admitting: Physical Medicine and Rehabilitation

## 2024-09-02 ENCOUNTER — Encounter: Attending: Physical Medicine and Rehabilitation | Admitting: Physical Medicine and Rehabilitation

## 2024-09-02 VITALS — BP 148/103 | HR 87 | Ht 72.0 in | Wt 280.2 lb

## 2024-09-02 DIAGNOSIS — M545 Low back pain, unspecified: Secondary | ICD-10-CM | POA: Insufficient documentation

## 2024-09-02 DIAGNOSIS — I272 Pulmonary hypertension, unspecified: Secondary | ICD-10-CM | POA: Insufficient documentation

## 2024-09-02 DIAGNOSIS — R42 Dizziness and giddiness: Secondary | ICD-10-CM | POA: Insufficient documentation

## 2024-09-02 MED ORDER — TIZANIDINE HCL 4 MG PO TABS: 4.0000 mg | ORAL_TABLET | Freq: Two times a day (BID) | ORAL | 0 refills | Status: AC

## 2024-09-02 NOTE — Progress Notes (Signed)
 Subjective:    Patient ID: Gene Hansen, male    DOB: Apr 05, 1976, 48 y.o.   MRN: [id number]  HPI   Gene Hansen is a 48 year old man who presents to establish care for low back pain.  1) Low back pain: -failed injections, medial branch block, gabapentin, tylenol ; cannot take NSAIDs/aspirin given bleeding ulcer in the past, PT; has been told that he is not a good surgical candidate given that his spine is too narrow; failed trazodone to help him sleep; he has been given oxycodone and this also exacerbates his vertigo -has been diagnosed with scoliosis, spinal stenosis, bone spurs, disc degeneration -he is planning to follow at [facility] for eval for spinal cord stimulator -Qutenza  did not help -tizanidine  is helping  2) HTN: -discussed that BP is better but still high -says his PCP has given him blood pressure medication  3) Vertigo: -has never did PT for this -discussed MRI brain results -this is now well controlled  Pain Inventory Average Pain 10 Pain Right Now 10 My pain is sharp, burning, dull, stabbing, tingling, and aching  In the last 24 hours, has pain interfered with the following? General activity 10 Relation with others 10 Enjoyment of life 10 What TIME of day is your pain at its worst? morning , daytime, evening, and night Sleep (in general) Poor  Pain is worse with: walking, bending, sitting, inactivity, standing, unsure, and some activites Pain improves with: nothing Relief from Meds: 0  walk with assistance use a cane how many minutes can you walk? 30 ability to climb steps?  no do you drive?  yes  disabled: date disabled SSI 2023  weakness numbness tingling trouble walking depression anxiety  New pt  New pt    No family history on file. Social History   Socioeconomic History   Marital status: Single    Spouse name: Not on file   Number of children: Not on file   Years of education: Not on file   Highest education level: Not on file   Occupational History   Not on file  Tobacco Use   Smoking status: Never   Smokeless tobacco: Never  Substance and Sexual Activity   Alcohol use: No   Drug use: No   Sexual activity: Not on file  Other Topics Concern   Not on file  Social History Narrative   Not on file   Social Drivers of Health   Financial Resource Strain: Not on file  Food Insecurity: No Food Insecurity (10/18/2022)   [facility] from [facility]   Hunger Vital Sign    Within the past 12 months, you worried that your food would run out before you got the money to buy more.: Never true    Within the past 12 months, the food you bought just didn't last and you didn't have money to get more.: Never true  Transportation Needs: Not on file  Physical Activity: Not on file  Stress: No Stress Concern Present (08/17/2021)   Received from [facility] - Occupational Stress Questionnaire    Feeling of Stress : Not at all  Social Connections: Unknown (03/18/2022)   Received from [facility]   Social Network    Social Network: Not on file   Past Surgical History:  Procedure Laterality Date   KIDNEY STONE SURGERY     lung tumor     removal   testicular tumor     removal  Past Medical History:  Diagnosis Date   Bleeding ulcer    Depression, major    Gout    Kidney stone    Lung nodule    Neuropathy    Neuropathy, lower extremity    bilateral lower extremities and feet   OCD (obsessive compulsive disorder)    Panic attacks    Scoliosis    Spinal stenosis    Testicular mass    Vertigo    BP (!) 148/103   Pulse 87   Ht 6' (1.829 m)   Wt 280 lb 3.2 oz (127.1 kg)   SpO2 97%   BMI 38.00 kg/m   Opioid Risk Score:   Fall Risk Score:  `1  Depression screen [facility] 2/9     09/02/2024   11:27 AM 06/01/2024   10:06 AM 02/06/2024   10:56 AM  Depression screen PHQ 2/9  Decreased Interest 0 0 0  Down, Depressed, Hopeless 0 0 0  PHQ - 2 Score 0 0 0  Altered  sleeping  0 3  Tired, decreased energy  0 3  Change in appetite  0 0  Feeling bad or failure about yourself   0 0  Trouble concentrating  0 1  Moving slowly or fidgety/restless  0 0  Suicidal thoughts  0 0  PHQ-9 Score  0 7  Difficult doing work/chores  Not difficult at all Very difficult     Review of Systems  Musculoskeletal:  Positive for back pain and gait problem.       Bilateral leg pain Whole back side of body pain  Neurological:  Positive for weakness and numbness.  Psychiatric/Behavioral:  Positive for dysphoric mood. The patient is nervous/anxious.   All other systems reviewed and are negative.      Objective:   Physical Exam Gen: no distress, normal appearing HEENT: oral mucosa pink and moist, NCAT Cardio: Reg rate Chest: normal effort, normal rate of breathing Abd: soft, non-distended Ext: no edema Psych: pleasant, normal affect Skin: intact Neuro: Alert and oriented x3 MSK: 4/5 strength in bilateral lower extremities, ambulating with cane      Assessment & Plan:   1) Neuropathy: -discussed that Qutenza  was not helpful  2) Low back pain 2/2 scoliosis, disc degeneration -discussed that he has failed PT, oxycodone, gabapentin, cannot tolerate NSAIDs due to bleeding ulcer in the past, discussed that he has tried salonpas -butrans  refilled -lidocaine  patch ordered -increase tizanidine  to 4mg  BID -continue butrans   -discussed that Journavx  is a highly selective inhibitor for Nav 1.8, which is specific for pain in the peripheral nervous system, discussed that lidocaine  in contrast affects all Nav receptors, discussed loading dose is 2 50mg  tablets and this can be taken 1 hour before food, discussed that then 50mg  can be taken with out without food q12H until pain is tolerable. Discussed that potential side effects are pruritus, muscle spasms, increase blood creatinine, rash. Discussed that we have samples available and we have copay cards available. Discussed  that the 2 phase 3 clinicial trials sent to the FDA were for abdominoplasty and bunionectomy, discussed that it has been currently studied for 14 days, discussed that it can reduce efficacy of opioids and benzodiazepines, discussed that it has not be studied in severe hepatic impairment. Discussed that it is contraindicated with fluconazole. Discussed that it can interfere with hormonal contraception up to 28 days after use. Discussed that it can decrease fertility, it has not been studied in pregnancy, that it has been present in  animal milk during lactation, not recommended for GFR <15, discussed that outpatient if the medication requires a prior auth the copay should be $30 for at least a 60 day supply. The medication may be more likely to be in stock in [facility] and [facility]. We do have samples available  3) HTN/hypertensive urgency/pulmonary hypertension -encouraged checking BP daily -increase tizanidine  to 4mg  BID -Advised checking BP daily at home and logging results to bring into follow-up appointment with PCP and myself. -Reviewed BP meds today.  -Advised regarding healthy foods that can help lower blood pressure and provided with a list: 1) citrus foods- high in vitamins and minerals 2) salmon and other fatty fish - reduces inflammation and oxylipins 3) swiss chard (leafy green)- high level of nitrates 4) pumpkin seeds- one of the best natural sources of magnesium  5) Beans and lentils- high in fiber, magnesium , and potassium 6) Berries- high in flavonoids 7) Amaranth (whole grain, can be cooked similarly to rice and oats)- high in magnesium  and fiber 8) Pistachios- even more effective at reducing BP than other nuts 9) Carrots- high in phenolic compounds that relax blood vessels and reduce inflammation 10) Celery- contain phthalides that relax tissues of arterial walls 11) Tomatoes- can also improve cholesterol and reduce risk of heart disease 12) Broccoli- good source of magnesium , calcium,  and potassium 13) Greek yogurt: high in potassium and calcium 14) Herbs and spices: Celery seed, cilantro, saffron, lemongrass, black cumin, ginseng, cinnamon, cardamom, sweet basil, and ginger 15) Chia and flax seeds- also help to lower cholesterol and blood sugar 16) Beets- high levels of nitrates that relax blood vessels  17) spinach and bananas- high in potassium  -Provided lise of supplements that can help with hypertension:  1) magnesium : one high quality brand is Bioptemizers since it contains all 7 types of magnesium , otherwise over the counter magnesium  gluconate 400mg  is a good option 2) B vitamins 3) vitamin D 4) potassium 5) CoQ10 6) L-arginine 7) Vitamin C 8) Beetroot -Educated that goal BP is 120/80. -Made goal to incorporate some of the above foods into diet.    4) Vertigo: -discussed that this is now well controlled -discussed that gabapentin and oxycodone worsen this -discussed MRI brain results, discussed that there are no acute abnormalities, discussed that there is chronic small vessel ischemic changes  5) Insomnia: -recommended following up with pulmonology to assess for sleep apnea -discussed that if has sleep apnea this can make it hard to lose weight -Try to go outside near sunrise -Get exercise during the day.  -Turn off all devices an hour before bedtime.  -Teas that can benefit: chamomile, valerian root, Brahmi (Bacopa) -Can consider over the counter melatonin, magnesium , and/or L-theanine. Melatonin is an anti-oxidant with multiple health benefits. Magnesium  is involved in greater than 300 enzymatic reactions in the body and most of us  are deficient as our soil is often depleted. There are 7 different types of magnesium - Bioptemizer's is a supplement with all 7 types, and each has unique benefits. Magnesium  can also help with constipation and anxiety.  -Pistachios naturally increase the production of melatonin -[employer]zy [employer] bamboo bed sheets are free from  toxic chemicals.  -Tart cherry juice or a tart cherry supplement can improve sleep and soreness post-workout  6) Obesity: -discussed that he has been trying to eat a healthy diet

## 2024-09-14 ENCOUNTER — Other Ambulatory Visit: Payer: Self-pay

## 2024-09-14 MED ORDER — LIDOCAINE 5 % EX PTCH: 1.0000 | MEDICATED_PATCH | CUTANEOUS | 0 refills | Status: AC

## 2024-12-03 ENCOUNTER — Encounter: Admitting: Physical Medicine and Rehabilitation

## 2024-12-07 ENCOUNTER — Encounter: Admitting: Physical Medicine and Rehabilitation

## 2024-12-30 ENCOUNTER — Encounter: Admitting: Physical Medicine and Rehabilitation
